# Patient Record
Sex: Female | Born: 2005 | Race: Black or African American | Hispanic: No | Marital: Single | Smoking: Never smoker
Health system: Southern US, Community
[De-identification: ages and names within clinical notes are randomized; demographics above are authoritative.]

## PROBLEM LIST (undated history)

## (undated) ENCOUNTER — Emergency Department (HOSPITAL_COMMUNITY): Admission: EM | Payer: Medicaid Other

## (undated) ENCOUNTER — Inpatient Hospital Stay (HOSPITAL_COMMUNITY): Payer: Self-pay

## (undated) DIAGNOSIS — D573 Sickle-cell trait: Secondary | ICD-10-CM

## (undated) DIAGNOSIS — A749 Chlamydial infection, unspecified: Secondary | ICD-10-CM

## (undated) DIAGNOSIS — A549 Gonococcal infection, unspecified: Secondary | ICD-10-CM

## (undated) DIAGNOSIS — O24419 Gestational diabetes mellitus in pregnancy, unspecified control: Secondary | ICD-10-CM

## (undated) DIAGNOSIS — J45909 Unspecified asthma, uncomplicated: Secondary | ICD-10-CM

## (undated) DIAGNOSIS — O139 Gestational [pregnancy-induced] hypertension without significant proteinuria, unspecified trimester: Secondary | ICD-10-CM

## (undated) HISTORY — DX: Gonococcal infection, unspecified: A54.9

## (undated) HISTORY — DX: Chlamydial infection, unspecified: A74.9

## (undated) HISTORY — PX: NO PAST SURGERIES: SHX2092

---
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
## 2023-01-03 ENCOUNTER — Inpatient Hospital Stay (HOSPITAL_COMMUNITY)
Admission: AD | Admit: 2023-01-03 | Discharge: 2023-01-03 | Disposition: A | Discharge status: Home / Self Care | Payer: Medicaid Other | Attending: Obstetrics & Gynecology | Admitting: Obstetrics & Gynecology

## 2023-01-03 ENCOUNTER — Encounter (HOSPITAL_COMMUNITY): Payer: Self-pay | Admitting: Obstetrics & Gynecology

## 2023-01-03 DIAGNOSIS — Z3A19 19 weeks gestation of pregnancy: Secondary | ICD-10-CM | POA: Diagnosis not present

## 2023-01-03 DIAGNOSIS — O98512 Other viral diseases complicating pregnancy, second trimester: Secondary | ICD-10-CM | POA: Insufficient documentation

## 2023-01-03 DIAGNOSIS — Z1152 Encounter for screening for COVID-19: Secondary | ICD-10-CM | POA: Insufficient documentation

## 2023-01-03 DIAGNOSIS — D573 Sickle-cell trait: Secondary | ICD-10-CM

## 2023-01-03 DIAGNOSIS — J069 Acute upper respiratory infection, unspecified: Secondary | ICD-10-CM | POA: Diagnosis not present

## 2023-01-03 DIAGNOSIS — O99512 Diseases of the respiratory system complicating pregnancy, second trimester: Secondary | ICD-10-CM | POA: Insufficient documentation

## 2023-01-03 DIAGNOSIS — B9789 Other viral agents as the cause of diseases classified elsewhere: Secondary | ICD-10-CM | POA: Insufficient documentation

## 2023-01-03 DIAGNOSIS — Z3490 Encounter for supervision of normal pregnancy, unspecified, unspecified trimester: Secondary | ICD-10-CM

## 2023-01-03 DIAGNOSIS — O26892 Other specified pregnancy related conditions, second trimester: Secondary | ICD-10-CM | POA: Insufficient documentation

## 2023-01-03 DIAGNOSIS — O99012 Anemia complicating pregnancy, second trimester: Secondary | ICD-10-CM | POA: Insufficient documentation

## 2023-01-03 LAB — URINALYSIS, ROUTINE W REFLEX MICROSCOPIC
Bilirubin Urine: NEGATIVE
Glucose, UA: NEGATIVE mg/dL
Hgb urine dipstick: NEGATIVE
Ketones, ur: 5 mg/dL — AB
Leukocytes,Ua: NEGATIVE
Nitrite: NEGATIVE
Protein, ur: NEGATIVE mg/dL
Specific Gravity, Urine: 1.008 (ref 1.005–1.030)
pH: 8 (ref 5.0–8.0)

## 2023-01-03 LAB — RESP PANEL BY RT-PCR (RSV, FLU A&B, COVID)  RVPGX2
Influenza A by PCR: NEGATIVE
Influenza B by PCR: NEGATIVE
Resp Syncytial Virus by PCR: NEGATIVE
SARS Coronavirus 2 by RT PCR: NEGATIVE

## 2023-01-03 LAB — GROUP A STREP BY PCR: Group A Strep by PCR: NOT DETECTED

## 2023-01-03 MED ORDER — CYCLOBENZAPRINE HCL 10 MG PO TABS: 10.0000 mg | ORAL_TABLET | Freq: Two times a day (BID) | ORAL | 1 refills | Status: DC | PRN

## 2023-01-03 NOTE — MAU Provider Note (Signed)
History     [id number]  Arrival date and time: 01/03/23 1357    Chief Complaint  Patient presents with   Fever   Nasal Congestion   Headache     HPI Erin Mack is a 17 y.o. at [redacted]w[redacted]d, who presents for multiple symptoms.   Patient reports being sick for the past few days Primary symptoms have been runny nose, congestion, cough, headache, muscle aches, and fever up to 101F Reports her boyfriend is also sick but with milder symptoms Denies any associated with phlegm with cough No chest pain or shortness of breath No vaginal bleeding or leaking fluid No lower abdominal pain but does have intermittent epigastric pain  O/Positive/-- (08/21 1151)  OB History     Gravida  1   Para  0   Term  0   Preterm  0   AB  0   Living  0      SAB  0   IAB  0   Ectopic  0   Multiple  0   Live Births  0           Past Medical History:  Diagnosis Date   Asthma    Chlamydia    Gonorrhea    Sickle cell trait (HCC)     History reviewed. No pertinent surgical history.  Family History  Problem Relation Age of Onset   Cancer Paternal Grandfather    Lupus Paternal Grandmother    Cervical cancer Maternal Grandmother    Breast cancer Maternal Grandmother 57   Lupus Father    Sickle cell anemia Mother    Asthma Sister    Autism Sister     Social History   Socioeconomic History   Marital status: Single    Spouse name: Not on file   Number of children: Not on file   Years of education: Not on file   Highest education level: Not on file  Occupational History   Not on file  Tobacco Use   Smoking status: Never    Passive exposure: Never   Smokeless tobacco: Never  Vaping Use   Vaping status: Former  Substance and Sexual Activity   Alcohol use: Not Currently    Comment: occasionally   Drug use: Not Currently    Types: Marijuana    Comment: last used a few months prior to pregnancy   Sexual activity: Yes    Birth control/protection: None  Other  Topics Concern   Not on file  Social History Narrative   Not on file   Social Determinants of Health   Financial Resource Strain: Not on file  Food Insecurity: Not on file  Transportation Needs: Not on file  Physical Activity: Not on file  Stress: Not on file  Social Connections: Not on file  Intimate Partner Violence: Not on file    Allergies  Allergen Reactions   Black Walnut Pollen Allergy Skin Test Shortness Of Breath, Itching and Other (See Comments)    Mouth itches   Charentais Melon (French Melon) Shortness Of Breath, Itching, Nausea Only and Other (See Comments)    Mouth itches   Justicia Adhatoda (Malabar Nut Tree) [Justicia Adhatoda] Shortness Of Breath, Itching and Other (See Comments)    Mouth itches   Sesame Oil Shortness Of Breath, Itching and Other (See Comments)    Mouth itches   Watermelon [Citrullus Vulgaris] Shortness Of Breath, Itching and Other (See Comments)    Mouth itches   Aloe Rash and Other (See Comments)  Made the skin burn    No current facility-administered medications on file prior to encounter.   Current Outpatient Medications on File Prior to Encounter  Medication Sig Dispense Refill   Acetaminophen (TYLENOL PO) Take by mouth as needed.     Prenatal Vit-Fe Fumarate-FA (MULTIVITAMIN-PRENATAL) 27-0.8 MG TABS tablet Take 1 tablet by mouth daily at 12 noon. 30 tablet 12   ALBUTEROL IN Inhale into the lungs. (Patient not taking: Reported on 12/01/2022)       ROS Pertinent positives and negative per HPI, all others reviewed and negative  Physical Exam   BP (!) 99/54 (BP Location: Right Arm)   Pulse (!) 144   Temp 99 F (37.2 C) (Oral)   Resp 20   Ht 5\' 5"  (1.651 m)   Wt 77.6 kg   LMP 08/05/2022 (Within Weeks)   SpO2 100%   BMI 28.46 kg/m   Patient Vitals for the past 24 hrs:  BP Temp Temp src Pulse Resp SpO2 Height Weight  01/03/23 1416 (!) 99/54 99 F (37.2 C) Oral (!) 144 20 100 % 5\' 5"  (1.651 m) 77.6 kg    Physical  Exam Vitals reviewed.  Constitutional:      General: She is not in acute distress.    Appearance: She is well-developed. She is not diaphoretic.  Eyes:     General: No scleral icterus. Pulmonary:     Effort: Pulmonary effort is normal. No respiratory distress.  Abdominal:     General: There is no distension.     Palpations: Abdomen is soft.     Tenderness: There is abdominal tenderness. There is no guarding or rebound.     Comments: Diffuse mild ttp  Skin:    General: Skin is warm and dry.  Neurological:     Mental Status: She is alert.     Coordination: Coordination normal.      Cervical Exam    Bedside Ultrasound Not performed.  My interpretation: n/a  FHT 167 bpm by doppler  Labs Results for orders placed or performed during the hospital encounter of 01/03/23 (from the past 24 hour(s))  Resp panel by RT-PCR (RSV, Flu A&B, Covid) Anterior Nasal Swab     Status: None   Collection Time: 01/03/23  2:35 PM   Specimen: Anterior Nasal Swab  Result Value Ref Range   SARS Coronavirus 2 by RT PCR NEGATIVE NEGATIVE   Influenza A by PCR NEGATIVE NEGATIVE   Influenza B by PCR NEGATIVE NEGATIVE   Resp Syncytial Virus by PCR NEGATIVE NEGATIVE  Group A Strep by PCR     Status: None   Collection Time: 01/03/23  2:35 PM   Specimen: Anterior Nasal Swab; Sterile Swab  Result Value Ref Range   Group A Strep by PCR NOT DETECTED NOT DETECTED    Imaging No results found.  MAU Course  Procedures Lab Orders         Resp panel by RT-PCR (RSV, Flu A&B, Covid) Anterior Nasal Swab         Group A Strep by PCR         Urinalysis, Routine w reflex microscopic -Urine, Clean Catch    No orders of the defined types were placed in this encounter.  Imaging Orders  No imaging studies ordered today    MDM Moderate (Level 3-4)  Assessment and Plan  # Viral URI #[redacted] weeks gestation of pregnancy Well appearing on exam, no signs of respiratory distress or dehydration. Respiratory viral  panel negative.  Supportive care with tylenol, fluids, given safe meds in pregnancy list.  #FWB FHR normal on bedside doppler.    Dispo: discharged to home in stable condition    Venora Maples, MD/MPH 01/03/23 3:56 PM  Allergies as of 01/03/2023       Reactions   Black Walnut Pollen Allergy Skin Test Shortness Of Breath, Itching, Other (See Comments)   Mouth itches   Charentais Melon (french Melon) Shortness Of Breath, Itching, Nausea Only, Other (See Comments)   Mouth itches   Justicia Adhatoda (malabar Nut Tree) [justicia Adhatoda] Shortness Of Breath, Itching, Other (See Comments)   Mouth itches   Sesame Oil Shortness Of Breath, Itching, Other (See Comments)   Mouth itches   Watermelon [citrullus Vulgaris] Shortness Of Breath, Itching, Other (See Comments)   Mouth itches   Aloe Rash, Other (See Comments)   Made the skin burn        Medication List     TAKE these medications    ALBUTEROL IN Inhale into the lungs.   multivitamin-prenatal 27-0.8 MG Tabs tablet Take 1 tablet by mouth daily at 12 noon.   TYLENOL PO Take by mouth as needed.

## 2023-01-03 NOTE — Discharge Instructions (Signed)

## 2023-01-03 NOTE — MAU Note (Signed)
..  Erin Mack is a 17 y.o. at [redacted]w[redacted]d here in MAU reporting: for the last two days she has had a runny nose, muscle aches, fever, and a dry cough. Also reporting a sore throat. She also reports sharp pain in her lower abdomen. Denies VB or LOF. Has not taken tylenol since last night.   Pain score: body aches: 6    throat: 8    head: 10 Vitals:   01/03/23 1416  BP: (!) 99/54  Pulse: (!) 144  Resp: 20  Temp: 99 F (37.2 C)  SpO2: 100%     FHT:167 Lab orders placed from triage:   respiratory panel, group A

## 2023-01-04 ENCOUNTER — Ambulatory Visit: Payer: Medicaid Other | Admitting: Nurse Practitioner

## 2023-01-06 ENCOUNTER — Other Ambulatory Visit: Payer: Self-pay

## 2023-01-06 ENCOUNTER — Other Ambulatory Visit: Payer: Self-pay | Admitting: *Deleted

## 2023-01-06 ENCOUNTER — Ambulatory Visit: Payer: Medicaid Other

## 2023-01-06 ENCOUNTER — Ambulatory Visit: Payer: Medicaid Other | Attending: Family Medicine

## 2023-01-06 DIAGNOSIS — Z3A20 20 weeks gestation of pregnancy: Secondary | ICD-10-CM | POA: Insufficient documentation

## 2023-01-06 DIAGNOSIS — D573 Sickle-cell trait: Secondary | ICD-10-CM | POA: Insufficient documentation

## 2023-01-06 DIAGNOSIS — Z3490 Encounter for supervision of normal pregnancy, unspecified, unspecified trimester: Secondary | ICD-10-CM | POA: Diagnosis not present

## 2023-01-06 DIAGNOSIS — Z3A22 22 weeks gestation of pregnancy: Secondary | ICD-10-CM

## 2023-01-06 DIAGNOSIS — Z862 Personal history of diseases of the blood and blood-forming organs and certain disorders involving the immune mechanism: Secondary | ICD-10-CM | POA: Diagnosis not present

## 2023-01-06 DIAGNOSIS — Z363 Encounter for antenatal screening for malformations: Secondary | ICD-10-CM | POA: Diagnosis not present

## 2023-01-26 ENCOUNTER — Other Ambulatory Visit: Payer: Self-pay

## 2023-01-26 ENCOUNTER — Encounter: Payer: Self-pay | Admitting: Family Medicine

## 2023-01-26 ENCOUNTER — Ambulatory Visit (INDEPENDENT_AMBULATORY_CARE_PROVIDER_SITE_OTHER): Payer: Medicaid Other | Admitting: Family Medicine

## 2023-01-26 VITALS — BP 113/76 | HR 108 | Wt 184.0 lb

## 2023-01-26 DIAGNOSIS — D573 Sickle-cell trait: Secondary | ICD-10-CM

## 2023-01-26 DIAGNOSIS — Z3492 Encounter for supervision of normal pregnancy, unspecified, second trimester: Secondary | ICD-10-CM | POA: Diagnosis not present

## 2023-01-26 NOTE — Progress Notes (Signed)
   PRENATAL VISIT NOTE  Subjective:  Erin Mack is a 17 y.o. G1P0000 at [redacted]w[redacted]d being seen today for ongoing prenatal care.  She is currently monitored for the following issues for this low-risk pregnancy and has Asthma; Supervision of low-risk pregnancy; Vitamin D deficiency; Low TSH level; and Sickle cell trait (HCC) on their problem list.  Patient reports no complaints.  Contractions: Not present. Vag. Bleeding: None.  Movement: Present. Denies leaking of fluid.   The following portions of the patient's history were reviewed and updated as appropriate: allergies, current medications, past family history, past medical history, past social history, past surgical history and problem list.   Objective:   Vitals:   01/26/23 1620  BP: 113/76  Pulse: (!) 108  Weight: 184 lb (83.5 kg)    Fetal Status: Fetal Heart Rate (bpm): 158 Fundal Height: 24 cm Movement: Present     General:  Alert, oriented and cooperative. Patient is in no acute distress.  Skin: Skin is warm and dry. No rash noted.   Cardiovascular: Normal heart rate noted  Respiratory: Normal respiratory effort, no problems with respiration noted  Abdomen: Soft, gravid, appropriate for gestational age.  Pain/Pressure: Present     Pelvic: Cervical exam deferred        Extremities: Normal range of motion.     Mental Status: Normal mood and affect. Normal behavior. Normal judgment and thought content.   Assessment and Plan:  Pregnancy: G1P0000 at [redacted]w[redacted]d 1. Encounter for supervision of low-risk pregnancy in second trimester Up to date FH appropriate Vigorous movement No concerns today Anticipated next visit.   2. Sickle Cell trait UCx next visit   Preterm labor symptoms and general obstetric precautions including but not limited to vaginal bleeding, contractions, leaking of fluid and fetal movement were reviewed in detail with the patient. Please refer to After Visit Summary for other counseling recommendations.   Return  in about 4 weeks (around 02/23/2023) for Routine prenatal care, [facility].  Future Appointments  Date Time Provider Department Center  02/22/2023  8:15 AM Venora Maples, MD [facility]  02/22/2023  8:50 AM [facility]  03/03/2023  3:30 PM [facility] US1 [facility]    Federico Flake, MD

## 2023-02-11 ENCOUNTER — Other Ambulatory Visit: Payer: Self-pay

## 2023-02-11 DIAGNOSIS — Z3A26 26 weeks gestation of pregnancy: Secondary | ICD-10-CM

## 2023-02-22 ENCOUNTER — Encounter: Payer: Self-pay | Admitting: Family Medicine

## 2023-02-22 ENCOUNTER — Other Ambulatory Visit: Payer: Self-pay

## 2023-02-22 ENCOUNTER — Ambulatory Visit (INDEPENDENT_AMBULATORY_CARE_PROVIDER_SITE_OTHER): Payer: Medicaid Other | Admitting: Family Medicine

## 2023-02-22 ENCOUNTER — Other Ambulatory Visit: Payer: Medicaid Other

## 2023-02-22 VITALS — BP 116/75 | HR 105 | Wt 196.6 lb

## 2023-02-22 DIAGNOSIS — E559 Vitamin D deficiency, unspecified: Secondary | ICD-10-CM

## 2023-02-22 DIAGNOSIS — R7989 Other specified abnormal findings of blood chemistry: Secondary | ICD-10-CM

## 2023-02-22 DIAGNOSIS — Z23 Encounter for immunization: Secondary | ICD-10-CM | POA: Diagnosis not present

## 2023-02-22 DIAGNOSIS — O24419 Gestational diabetes mellitus in pregnancy, unspecified control: Secondary | ICD-10-CM

## 2023-02-22 DIAGNOSIS — Z3492 Encounter for supervision of normal pregnancy, unspecified, second trimester: Secondary | ICD-10-CM

## 2023-02-22 DIAGNOSIS — D573 Sickle-cell trait: Secondary | ICD-10-CM

## 2023-02-22 DIAGNOSIS — Z3A26 26 weeks gestation of pregnancy: Secondary | ICD-10-CM

## 2023-02-22 NOTE — Progress Notes (Signed)
Subjective:  Erin Mack is a 17 y.o. G1P0000 at [redacted]w[redacted]d being seen today for ongoing prenatal care.  She is currently monitored for the following issues for this low-risk pregnancy and has Asthma; Supervision of low-risk pregnancy; Vitamin D deficiency; Low TSH level; and Sickle cell trait (HCC) on their problem list.  Patient reports  diffuse pain. Her mother has sickle cell and is worried she is having pain crises with her sickle trait. Horizon not performed in this pregnancy .  Contractions: Irritability. Vag. Bleeding: None.  Movement: Present. Denies leaking of fluid.   The following portions of the patient's history were reviewed and updated as appropriate: allergies, current medications, past family history, past medical history, past social history, past surgical history and problem list. Problem list updated.  Objective:   Vitals:   02/22/23 0849  BP: 116/75  Pulse: (!) 105  Weight: 196 lb 9.6 oz (89.2 kg)    Fetal Status: Fetal Heart Rate (bpm): 155   Movement: Present     General:  Alert, oriented and cooperative. Patient is in no acute distress.  Skin: Skin is warm and dry. No rash noted.   Cardiovascular: Normal heart rate noted  Respiratory: Normal respiratory effort, no problems with respiration noted  Abdomen: Soft, gravid, appropriate for gestational age. Pain/Pressure: Present     Pelvic: Vag. Bleeding: None     Cervical exam deferred        Extremities: Normal range of motion.     Mental Status: Normal mood and affect. Normal behavior. Normal judgment and thought content.   Urinalysis:      Assessment and Plan:  Pregnancy: G1P0000 at [redacted]w[redacted]d  1. Encounter for supervision of low-risk pregnancy in second trimester BP and FHR normal 28 wk labs today TDAP given  Flu previously declined  2. Low TSH level Noted in chart, no recent checks, will obtain today - TSH Rfx on Abnormal to Free T4  3. Vitamin D deficiency Last level 10 a year ago, severely  deficient Recheck today but will likely need supplementation - Vitamin D (25 hydroxy)  4. Sickle cell trait (HCC) Q trimester UCx per MFM Reviewed concern for pain crises with patient and mother, long conversation Reviewed chart in detail, has previously had an extensive and unrevealing workup with Rheumatology at [facility] in 2022 and 2023 Per last note there was no clear etiology, some mild endocrine abnormalities were found and patient was referred to Endocrine and Neurology for possible eval of neuropathy Patient's mother worried she may be having pain crises which she had during her pregnancy Discussed it is not a good idea to overlay experiences from one pregnancy to another, and that at present Erin Mack has had an extensive workup previously In addition she is wanting to know about a pain regimen, specifically opioid medicines. They both report that tylenol and flexeril are not effective I discussed that this would be inappropriate outside of true sickle cell and would come with unacceptable side effects (risk of addiction being foremost for mom and NAS for baby) Notably though we do not have hemoglobinopathy testing on [name], will collect that today Pending that will determine what therapy to pursue, likely neuropathy (gabapentin, duloxetine, etc.) - Culture, OB Urine  Preterm labor symptoms and general obstetric precautions including but not limited to vaginal bleeding, contractions, leaking of fluid and fetal movement were reviewed in detail with the patient. Please refer to After Visit Summary for other counseling recommendations.  Return in 2 weeks (on 03/08/2023) for Dyad  patient, ob visit.   Venora Maples, MD

## 2023-02-22 NOTE — Patient Instructions (Signed)

## 2023-02-23 ENCOUNTER — Telehealth: Payer: Self-pay

## 2023-02-23 ENCOUNTER — Encounter: Payer: Self-pay | Admitting: Family Medicine

## 2023-02-23 DIAGNOSIS — O24419 Gestational diabetes mellitus in pregnancy, unspecified control: Secondary | ICD-10-CM | POA: Insufficient documentation

## 2023-02-23 DIAGNOSIS — D696 Thrombocytopenia, unspecified: Secondary | ICD-10-CM | POA: Insufficient documentation

## 2023-02-23 LAB — CBC
Hematocrit: 32 % — ABNORMAL LOW (ref 34.0–46.6)
Hemoglobin: 10.7 g/dL — ABNORMAL LOW (ref 11.1–15.9)
MCH: 27.6 pg (ref 26.6–33.0)
MCHC: 33.4 g/dL (ref 31.5–35.7)
MCV: 83 fL (ref 79–97)
Platelets: 127 10*3/uL — ABNORMAL LOW (ref 150–450)
RBC: 3.88 x10E6/uL (ref 3.77–5.28)
RDW: 12.5 % (ref 11.7–15.4)
WBC: 9.1 10*3/uL (ref 3.4–10.8)

## 2023-02-23 LAB — GLUCOSE TOLERANCE, 2 HOURS W/ 1HR
Glucose, 1 hour: 107 mg/dL (ref 70–179)
Glucose, 2 hour: 96 mg/dL (ref 70–152)
Glucose, Fasting: 94 mg/dL — ABNORMAL HIGH (ref 70–91)

## 2023-02-23 LAB — HIV ANTIBODY (ROUTINE TESTING W REFLEX): HIV Screen 4th Generation wRfx: NONREACTIVE

## 2023-02-23 LAB — TSH RFX ON ABNORMAL TO FREE T4: TSH: 1.69 u[IU]/mL (ref 0.450–4.500)

## 2023-02-23 LAB — RPR: RPR Ser Ql: NONREACTIVE

## 2023-02-23 LAB — VITAMIN D 25 HYDROXY (VIT D DEFICIENCY, FRACTURES): Vit D, 25-Hydroxy: 23.7 ng/mL — ABNORMAL LOW (ref 30.0–100.0)

## 2023-02-23 MED ORDER — ACCU-CHEK SOFTCLIX LANCETS MISC
12 refills | Status: DC
Start: 2023-02-23 — End: 2023-05-12

## 2023-02-23 MED ORDER — GLUCOSE BLOOD VI STRP: ORAL_STRIP | 12 refills | Status: DC

## 2023-02-23 MED ORDER — ACCU-CHEK GUIDE W/DEVICE KIT: 1.0000 | PACK | Freq: Four times a day (QID) | 0 refills | Status: DC

## 2023-02-23 NOTE — Telephone Encounter (Signed)
-----   Message from Venora Maples sent at 02/23/2023  8:39 AM EST ----- 28 wk labs with mild gestational thrombocytopenia and abnormal GTT. Unfortunately has GDM, clinical please send supplies, admin please schedule DM educator visit

## 2023-02-23 NOTE — Telephone Encounter (Signed)
Left voicemail to inform patient of results from recent labs that was done at our office. Need to inform patient of glucose results and to tell patient we sent in supplies to pharmacy and made an appointment regarding GDM education.  Appointment made for patient for GDM education at [facility] on [address] for Wednesday, December 11th at 9:00AM. This session will take approximately 2 hours long.  Marcelino Duster, RN

## 2023-02-23 NOTE — Telephone Encounter (Signed)
Call placed to pt. Pt did not answer. Left VM with information to return call to office for results. Will send mychart message today. Supplies sent to pharmacy on 02/23/2023. Diabetes Ed appt on 03/17/23 at 315pm  [place], [facility]

## 2023-02-24 LAB — URINE CULTURE, OB REFLEX

## 2023-02-24 LAB — CULTURE, OB URINE

## 2023-02-24 NOTE — Telephone Encounter (Signed)
Called pt and pt informed me that she has already picked up testing supplies and that she has a class scheduled on 03/02/23.  I advised pt that at her appt scheduled on 03/02/23 they teach how to obtain her BS.   Pt verbalized understanding with on further questions.   Dariel Betzer,RN  02/24/23

## 2023-02-25 LAB — HGB FRACTIONATION CASCADE
Hgb A2: 3.3 % — ABNORMAL HIGH (ref 1.8–3.2)
Hgb A: 61.7 % — ABNORMAL LOW (ref 96.4–98.8)
Hgb F: 0 % (ref 0.0–2.0)
Hgb S: 35 % — ABNORMAL HIGH

## 2023-02-25 LAB — HGB SOLUBILITY: Hgb Solubility: POSITIVE — AB

## 2023-03-02 ENCOUNTER — Encounter: Payer: Medicaid Other | Attending: Family Medicine | Admitting: Dietician

## 2023-03-02 DIAGNOSIS — O24419 Gestational diabetes mellitus in pregnancy, unspecified control: Secondary | ICD-10-CM | POA: Insufficient documentation

## 2023-03-02 NOTE — Progress Notes (Signed)
Patient was seen on 03/02/2023 for Gestational Diabetes self-management class at the [facility]. The following learning objectives were met by the patient during this course:  States the definition of Gestational Diabetes States why dietary management is important in controlling blood glucose Describes the effects each nutrient has on blood glucose levels Demonstrates ability to create a balanced meal plan Demonstrates carbohydrate counting  States when to check blood glucose levels States the effect of stress and exercise on blood glucose levels States the importance of limiting caffeine and abstaining from alcohol and smoking Meter instruction for Accu Chek guide  Blood glucose monitor: none RD spoke with pharmacy staff and provided free meter code below. Pharmacy staff state accu chek meter and supplies ready for pick up Free Accu Chek meter BIN#: [id number] PCN#: [id number], Group#: [id number]  Blood glucose reading: 94 mg/dL, reported as fasting per Pt   Patient instructed to monitor glucose levels: QID FBS: 60 - <90 1 hour: <140 2 hour: <120  *Patient received handouts: Nutrition Diabetes and Pregnancy Carbohydrate Counting List Blood glucose log Snack ideas for diabetes during pregnancy  Patient will be seen for follow-up as needed.

## 2023-03-03 ENCOUNTER — Ambulatory Visit: Payer: Medicaid Other | Admitting: Maternal & Fetal Medicine

## 2023-03-03 ENCOUNTER — Ambulatory Visit: Payer: Medicaid Other

## 2023-03-03 ENCOUNTER — Other Ambulatory Visit: Payer: Self-pay | Admitting: *Deleted

## 2023-03-03 ENCOUNTER — Ambulatory Visit: Payer: Medicaid Other | Attending: Obstetrics

## 2023-03-03 DIAGNOSIS — O99113 Other diseases of the blood and blood-forming organs and certain disorders involving the immune mechanism complicating pregnancy, third trimester: Secondary | ICD-10-CM | POA: Insufficient documentation

## 2023-03-03 DIAGNOSIS — O26643 Intrahepatic cholestasis of pregnancy, third trimester: Secondary | ICD-10-CM | POA: Insufficient documentation

## 2023-03-03 DIAGNOSIS — D696 Thrombocytopenia, unspecified: Secondary | ICD-10-CM | POA: Insufficient documentation

## 2023-03-03 DIAGNOSIS — O24410 Gestational diabetes mellitus in pregnancy, diet controlled: Secondary | ICD-10-CM | POA: Diagnosis not present

## 2023-03-03 DIAGNOSIS — Z3A28 28 weeks gestation of pregnancy: Secondary | ICD-10-CM | POA: Diagnosis present

## 2023-03-03 DIAGNOSIS — O09292 Supervision of pregnancy with other poor reproductive or obstetric history, second trimester: Secondary | ICD-10-CM | POA: Diagnosis not present

## 2023-03-03 DIAGNOSIS — D573 Sickle-cell trait: Secondary | ICD-10-CM | POA: Insufficient documentation

## 2023-03-03 DIAGNOSIS — O0993 Supervision of high risk pregnancy, unspecified, third trimester: Secondary | ICD-10-CM | POA: Diagnosis present

## 2023-03-03 DIAGNOSIS — K831 Obstruction of bile duct: Secondary | ICD-10-CM

## 2023-03-03 DIAGNOSIS — O26613 Liver and biliary tract disorders in pregnancy, third trimester: Secondary | ICD-10-CM

## 2023-03-03 DIAGNOSIS — O99019 Anemia complicating pregnancy, unspecified trimester: Secondary | ICD-10-CM | POA: Diagnosis present

## 2023-03-03 DIAGNOSIS — O99013 Anemia complicating pregnancy, third trimester: Secondary | ICD-10-CM | POA: Diagnosis not present

## 2023-03-03 LAB — HORIZON CUSTOM: REPORT SUMMARY: POSITIVE — AB

## 2023-03-03 NOTE — Progress Notes (Signed)
Patient information  Patient Name: Erin Mack  Patient MRN:   [id number]  Referring practice: MFM Referring Provider: [facility])  MFM CONSULT  [facility] Gl[facility] is a 17 y.o. G1P0000 at [redacted]w[redacted]d here for ultrasound and consultation.   The patient is here for ultrasound for newly diagnosed GDM. EDD: 05/25/2023 dated by U/S C R L  (10/20/22).  Her pregnancy is complicated by newly diagnosed GDM and has one day of blood glucose values and all of them are at goal.  She understands the potential complications associated with gestational diabetes in pregnancy such as growth abnormalities as well as the importance of good glucose control.   The patient reports new onset itching that is worse at night on her hands feet and abdomen.  Her mother, who is with her today, reports that she had cholestasis of pregnancy and is concerned about her daughter having it as well.  I discussed that this is a clinical diagnosis and most likely what she has.  Will order blood work today to confirm.  I discussed the concern for spontaneous stillbirth in the setting of cholestasis especially with high bile acid levels.  I also discussed need for antenatal testing to start around 32 weeks unless the bile acids are very high.  If they are over 100 then antenatal testing should start within the week of diagnosis.  Sonographic findings Single intrauterine pregnancy at 28w 1d.  Fetal cardiac activity:  Observed and appears normal. Presentation: Cephalic. Interval fetal anatomy appears normal.  Fetal biometry shows the estimated fetal weight at the 65 percentile. Amniotic fluid volume: Within normal limits. MVP: 3.71 cm. Placenta: Posterior. Normal fetal movement and tone.  Patient Active Problem List   Diagnosis Date Noted   Cholestasis of pregnancy in third trimester 03/03/2023   Gestational thrombocytopenia (HCC) 02/23/2023   GDM (gestational diabetes mellitus) 02/23/2023   Sickle cell trait  (HCC) 11/10/2022   Asthma 10/19/2022   Supervision of low-risk pregnancy 10/19/2022   Vitamin D deficiency 05/08/2021   Low TSH level 05/08/2021   Recommendations 1. Serial growth ultrasounds every 4 weeks until delivery 2.  Antenatal testing is only indicated if she requires medication to control her blood glucose 3.  CMP and bile acids due to concern for cholestasis.  Regardless of the bile acid level this is a clinical diagnosis and should be managed accordingly.  Repeat bile acids every month are important to help stratify delivery timing.   Review of Systems: A review of systems was performed and was negative except per HPI   Vitals and Physical Exam    02/22/2023    8:49 AM 01/26/2023    4:20 PM 01/03/2023    4:08 PM  Vitals with BMI  Weight 196 lbs 10 oz 184 lbs   BMI  30.62   Systolic 116 113 782  Diastolic 75 76 62  Pulse 105 108 128    Sitting comfortably on the sonogram table Nonlabored breathing Normal rate and rhythm Abdomen is nontender  Past pregnancies OB History  Gravida Para Term Preterm AB Living  1 0 0 0 0 0  SAB IAB Ectopic Multiple Live Births  0 0 0 0 0    # Outcome Date GA Lbr Len/2nd Weight Sex Type Anes PTL Lv  1 Current             I spent 45 minutes reviewing the patients chart, including labs and images as well as counseling the patient about  her medical conditions. Greater than 50% of the time was spent in direct face-to-face patient counseling.  Braxton Feathers  MFM, [place]   03/03/2023  4:30 PM

## 2023-03-04 LAB — COMPREHENSIVE METABOLIC PANEL
ALT: 25 [IU]/L (ref 0–32)
AST: 15 [IU]/L (ref 0–40)
Albumin: 3.7 g/dL — ABNORMAL LOW (ref 4.0–5.0)
Alkaline Phosphatase: 97 [IU]/L (ref 42–106)
BUN/Creatinine Ratio: 6 — ABNORMAL LOW (ref 9–23)
BUN: 4 mg/dL — ABNORMAL LOW (ref 6–20)
Bilirubin Total: 0.3 mg/dL (ref 0.0–1.2)
CO2: 23 mmol/L (ref 20–29)
Calcium: 9.2 mg/dL (ref 8.7–10.2)
Chloride: 107 mmol/L — ABNORMAL HIGH (ref 96–106)
Creatinine, Ser: 0.69 mg/dL (ref 0.57–1.00)
Globulin, Total: 2.5 g/dL (ref 1.5–4.5)
Glucose: 107 mg/dL — ABNORMAL HIGH (ref 70–99)
Potassium: 4.3 mmol/L (ref 3.5–5.2)
Sodium: 141 mmol/L (ref 134–144)
Total Protein: 6.2 g/dL (ref 6.0–8.5)
eGFR: 128 mL/min/{1.73_m2} (ref >59)

## 2023-03-04 LAB — BILE ACIDS, TOTAL: Bile Acids Total: 8.3 umol/L (ref 0.0–10.0)

## 2023-03-07 ENCOUNTER — Encounter: Payer: Self-pay | Admitting: Family Medicine

## 2023-03-07 DIAGNOSIS — D563 Thalassemia minor: Secondary | ICD-10-CM | POA: Insufficient documentation

## 2023-03-08 ENCOUNTER — Encounter: Payer: Self-pay | Admitting: Family Medicine

## 2023-03-08 ENCOUNTER — Ambulatory Visit: Payer: Medicaid Other | Admitting: Family Medicine

## 2023-03-08 ENCOUNTER — Other Ambulatory Visit: Payer: Self-pay

## 2023-03-08 VITALS — BP 120/68 | HR 122 | Wt 193.8 lb

## 2023-03-08 DIAGNOSIS — R7989 Other specified abnormal findings of blood chemistry: Secondary | ICD-10-CM | POA: Diagnosis not present

## 2023-03-08 DIAGNOSIS — D563 Thalassemia minor: Secondary | ICD-10-CM

## 2023-03-08 DIAGNOSIS — E559 Vitamin D deficiency, unspecified: Secondary | ICD-10-CM | POA: Diagnosis not present

## 2023-03-08 DIAGNOSIS — Z1332 Encounter for screening for maternal depression: Secondary | ICD-10-CM

## 2023-03-08 DIAGNOSIS — O24419 Gestational diabetes mellitus in pregnancy, unspecified control: Secondary | ICD-10-CM

## 2023-03-08 DIAGNOSIS — Z3A28 28 weeks gestation of pregnancy: Secondary | ICD-10-CM

## 2023-03-08 DIAGNOSIS — O26643 Intrahepatic cholestasis of pregnancy, third trimester: Secondary | ICD-10-CM

## 2023-03-08 DIAGNOSIS — O0993 Supervision of high risk pregnancy, unspecified, third trimester: Secondary | ICD-10-CM | POA: Diagnosis not present

## 2023-03-08 DIAGNOSIS — D573 Sickle-cell trait: Secondary | ICD-10-CM

## 2023-03-08 NOTE — Progress Notes (Signed)
   Subjective:  Erin Mack is a 17 y.o. G1P0000 at [redacted]w[redacted]d being seen today for ongoing prenatal care.  She is currently monitored for the following issues for this high-risk pregnancy and has Asthma; Supervision of high risk pregnancy, antepartum, third trimester; Vitamin D deficiency; Sickle cell trait (HCC); Gestational thrombocytopenia (HCC); GDM (gestational diabetes mellitus); Cholestasis of pregnancy in third trimester; and Alpha thalassemia silent carrier on their problem list.  Patient reports  diffuse itching .  Contractions: Not present. Vag. Bleeding: None.  Movement: Present. Denies leaking of fluid.   The following portions of the patient's history were reviewed and updated as appropriate: allergies, current medications, past family history, past medical history, past social history, past surgical history and problem list. Problem list updated.  Objective:   Vitals:   03/08/23 1604  BP: 120/68  Pulse: (!) 122  Weight: 193 lb 12.8 oz (87.9 kg)    Fetal Status: Fetal Heart Rate (bpm): 156   Movement: Present     General:  Alert, oriented and cooperative. Patient is in no acute distress.  Skin: Skin is warm and dry. No rash noted.   Cardiovascular: Normal heart rate noted  Respiratory: Normal respiratory effort, no problems with respiration noted  Abdomen: Soft, gravid, appropriate for gestational age. Pain/Pressure: Absent     Pelvic: Vag. Bleeding: None     Cervical exam deferred        Extremities: Normal range of motion.     Mental Status: Normal mood and affect. Normal behavior. Normal judgment and thought content.   Urinalysis:      Assessment and Plan:  Pregnancy: G1P0000 at [redacted]w[redacted]d  1. Supervision of high risk pregnancy, antepartum, third trimester (Primary) BP and FHR normal  2. Low TSH level Normal on last check, resolved from PL  3. Vitamin D deficiency Borderline at 23 on last check, OTC supplements  4. Sickle cell trait (HCC) Long discussion regarding  this at last visit, patient's mother worried about possible pain crises Given lack of formal testing recommended we proceed with hemoglobinopathy and Horizon testing which showed both sickle trait and silent alpha thal carrier statuses  Partner reports that her partner has had testing and is definitely neg for sickle, she will check about alpha thalassemia Based on this new information discussed best option would be to see pain specialist. After discussion agreed this would be best to do postpartum  5. Gestational diabetes mellitus (GDM), antepartum, gestational diabetes method of control unspecified New diagnosis after last visit Log reviewed, only one week to review Last growth [place] 03/03/2023, EFW 65% 1295g, AC 66%, AFI wnl  6. 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 For now plan to continue checking labs q month and reassess in the future Stressed importance of letting us know if she develops palmar/foot itching as this would significantly impact management  7. Alpha thalassemia silent carrier See above  Preterm labor symptoms and general obstetric precautions including but not limited to vaginal bleeding, contractions, leaking of fluid and fetal movement were reviewed in detail with the patient. Please refer to After Visit Summary for other counseling recommendations.  Return in about 2 weeks (around 03/22/2023) for Dyad patient, ob visit.   Venora Maples, MD

## 2023-03-08 NOTE — Patient Instructions (Signed)

## 2023-03-17 ENCOUNTER — Other Ambulatory Visit: Payer: Medicaid Other

## 2023-03-22 ENCOUNTER — Encounter (HOSPITAL_COMMUNITY): Payer: Self-pay | Admitting: Family Medicine

## 2023-03-22 ENCOUNTER — Other Ambulatory Visit (HOSPITAL_COMMUNITY)
Admission: RE | Admit: 2023-03-22 | Discharge: 2023-03-22 | Disposition: A | Discharge status: Home / Self Care | Payer: Medicaid Other | Source: Ambulatory Visit | Attending: Family Medicine | Admitting: Family Medicine

## 2023-03-22 ENCOUNTER — Ambulatory Visit (INDEPENDENT_AMBULATORY_CARE_PROVIDER_SITE_OTHER): Payer: Medicaid Other | Admitting: Family Medicine

## 2023-03-22 ENCOUNTER — Inpatient Hospital Stay (HOSPITAL_COMMUNITY)
Admission: AD | Admit: 2023-03-22 | Discharge: 2023-03-22 | Disposition: A | Discharge status: Home / Self Care | Payer: Medicaid Other | Attending: Family Medicine | Admitting: Family Medicine

## 2023-03-22 ENCOUNTER — Other Ambulatory Visit: Payer: Self-pay

## 2023-03-22 VITALS — BP 100/70 | HR 99 | Wt 198.8 lb

## 2023-03-22 DIAGNOSIS — R519 Headache, unspecified: Secondary | ICD-10-CM

## 2023-03-22 DIAGNOSIS — D573 Sickle-cell trait: Secondary | ICD-10-CM | POA: Diagnosis not present

## 2023-03-22 DIAGNOSIS — O0993 Supervision of high risk pregnancy, unspecified, third trimester: Secondary | ICD-10-CM

## 2023-03-22 DIAGNOSIS — B3731 Acute candidiasis of vulva and vagina: Secondary | ICD-10-CM | POA: Diagnosis present

## 2023-03-22 DIAGNOSIS — O26893 Other specified pregnancy related conditions, third trimester: Secondary | ICD-10-CM | POA: Diagnosis not present

## 2023-03-22 DIAGNOSIS — O99013 Anemia complicating pregnancy, third trimester: Secondary | ICD-10-CM | POA: Insufficient documentation

## 2023-03-22 DIAGNOSIS — O24410 Gestational diabetes mellitus in pregnancy, diet controlled: Secondary | ICD-10-CM

## 2023-03-22 DIAGNOSIS — O24419 Gestational diabetes mellitus in pregnancy, unspecified control: Secondary | ICD-10-CM

## 2023-03-22 DIAGNOSIS — D6959 Other secondary thrombocytopenia: Secondary | ICD-10-CM | POA: Insufficient documentation

## 2023-03-22 DIAGNOSIS — O26643 Intrahepatic cholestasis of pregnancy, third trimester: Secondary | ICD-10-CM

## 2023-03-22 DIAGNOSIS — E559 Vitamin D deficiency, unspecified: Secondary | ICD-10-CM

## 2023-03-22 DIAGNOSIS — O99113 Other diseases of the blood and blood-forming organs and certain disorders involving the immune mechanism complicating pregnancy, third trimester: Secondary | ICD-10-CM | POA: Insufficient documentation

## 2023-03-22 DIAGNOSIS — Z3A30 30 weeks gestation of pregnancy: Secondary | ICD-10-CM | POA: Diagnosis not present

## 2023-03-22 LAB — URINALYSIS, ROUTINE W REFLEX MICROSCOPIC
Bilirubin Urine: NEGATIVE
Glucose, UA: NEGATIVE mg/dL
Hgb urine dipstick: NEGATIVE
Ketones, ur: NEGATIVE mg/dL
Nitrite: NEGATIVE
Protein, ur: NEGATIVE mg/dL
Specific Gravity, Urine: 1.009 (ref 1.005–1.030)
pH: 7 (ref 5.0–8.0)

## 2023-03-22 LAB — GLUCOSE, CAPILLARY: Glucose-Capillary: 107 mg/dL — ABNORMAL HIGH (ref 70–99)

## 2023-03-22 MED ORDER — METOCLOPRAMIDE HCL 10 MG PO TABS: 10.0000 mg | ORAL_TABLET | Freq: Three times a day (TID) | ORAL | 1 refills | Status: DC | PRN

## 2023-03-22 MED ORDER — DIPHENHYDRAMINE HCL 25 MG PO CAPS
25.0000 mg | ORAL_CAPSULE | Freq: Once | ORAL | Status: AC
  Administered 2023-03-22: 25 mg via ORAL
  Filled 2023-03-22: qty 1

## 2023-03-22 MED ORDER — ACETAMINOPHEN 500 MG PO TABS
1000.0000 mg | ORAL_TABLET | Freq: Once | ORAL | Status: AC
  Administered 2023-03-22: 1000 mg via ORAL
  Filled 2023-03-22: qty 2

## 2023-03-22 MED ORDER — METOCLOPRAMIDE HCL 10 MG PO TABS
10.0000 mg | ORAL_TABLET | Freq: Once | ORAL | Status: AC
  Administered 2023-03-22: 10 mg via ORAL
  Filled 2023-03-22: qty 1

## 2023-03-22 MED ORDER — DIPHENHYDRAMINE HCL 25 MG PO CAPS: 25.0000 mg | ORAL_CAPSULE | Freq: Four times a day (QID) | ORAL | 1 refills | Status: DC | PRN

## 2023-03-22 NOTE — Progress Notes (Signed)
   PRENATAL VISIT NOTE  Subjective:  Erin Mack is a 17 y.o. G1P0000 at [redacted]w[redacted]d being seen today for ongoing prenatal care.  She is currently monitored for the following issues for this high-risk pregnancy and has Asthma; Supervision of high risk pregnancy, antepartum, third trimester; Vitamin D  deficiency; Sickle cell trait (HCC); Gestational thrombocytopenia (HCC); GDM (gestational diabetes mellitus); Cholestasis of pregnancy in third trimester; and Alpha thalassemia silent carrier on their problem list.  Patient reports no bleeding, no contractions, no cramping, and no leaking.  Contractions: Irritability. Vag. Bleeding: None.  Movement: Present. Denies leaking of fluid.   The following portions of the patient's history were reviewed and updated as appropriate: allergies, current medications, past family history, past medical history, past social history, past surgical history and problem list.   Objective:   Vitals:   03/22/23 1613  BP: 100/70  Pulse: 99  Weight: 198 lb 12.8 oz (90.2 kg)    Fetal Status: Fetal Heart Rate (bpm): 154   Movement: Present     General:  Alert, oriented and cooperative. Patient is in no acute distress.  Skin: Skin is warm and dry. No rash noted.   Cardiovascular: Normal heart rate noted  Respiratory: Normal respiratory effort, no problems with respiration noted  Abdomen: Soft, gravid, appropriate for gestational age.  Pain/Pressure: Present     Pelvic: Cervical exam deferred        Extremities: Normal range of motion.  Edema: None  Mental Status: Normal mood and affect. Normal behavior. Normal judgment and thought content.    Assessment and Plan:  Pregnancy: G1P0000 at [redacted]w[redacted]d 1. Supervision of high risk pregnancy, antepartum, third trimester (Primary) FHR and BP appropriate today  2. Sickle cell trait [facility]) Patient planning on seeing specialist in the postpartum period  3. Gestational diabetes mellitus (GDM), antepartum, gestational diabetes  method of control unspecified Blood sugars well-controlled.  She had for elevated blood sugars out of all of her checks.  Will continue to monitor. Ultrasound scheduled 03/31/2023 Next prenatal visit 04/06/2023  4. Vitamin D  deficiency On OTC supplements  5. [redacted] weeks gestation of pregnancy  6. 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 Patient had bile acids and CMP which were normal on that day.  Patient's itching was everywhere except for hands and feet.  Will continue to check bile acids and CMP monthly.  If she develops palmar/foot itching that may change when the testing occurs.  Preterm labor symptoms and general obstetric precautions including but not limited to vaginal bleeding, contractions, leaking of fluid and fetal movement were reviewed in detail with the patient. Please refer to After Visit Summary for other counseling recommendations.   No follow-ups on file.  Future Appointments  Date Time Provider Department Center  03/31/2023  3:15 PM [facility] NURSE [facility]  03/31/2023  3:30 PM [facility] US3 [facility]  04/06/2023  3:55 PM Eldonna Suzen Octave, MD [facility]  04/20/2023  3:55 PM Eldonna Suzen Octave, MD [facility]    Norleen LULLA Rover, MD

## 2023-03-22 NOTE — MAU Provider Note (Signed)
 MAU Provider Note  Chief Complaint: Headache   Event Date/Time   First Provider Initiated Contact with Patient 03/22/23 2115      SUBJECTIVE HPI: Erin Mack is a 17 y.o. G1P0000 at [redacted]w[redacted]d by early ultrasound who presents to maternity admissions reporting headache. Pregnancy c/b A1GDM, cholestasis, gestational thrombocytopenia, sickle cell trait. Receives [facility] with [facility].  Patient notes having a breakdown earlier this evening. Mom called EMS. Now has concern for headache. On her R temple. On and off for the past few days. Usually goes away on its own and did not today. Has not taken any medications. Denies photophobia, phonophobia. Denies any issues with BP this pregnancy. Did have an elevated sugar 156 by EMS.  HPI  Past Medical History:  Diagnosis Date   Asthma    Chlamydia    Gonorrhea    Sickle cell trait (HCC)    Past Surgical History:  Procedure Laterality Date   NO PAST SURGERIES     Social History   Socioeconomic History   Marital status: Single    Spouse name: Not on file   Number of children: Not on file   Years of education: Not on file   Highest education level: Not on file  Occupational History   Not on file  Tobacco Use   Smoking status: Never    Passive exposure: Never   Smokeless tobacco: Never  Vaping Use   Vaping status: Former  Substance and Sexual Activity   Alcohol use: Not Currently    Comment: occasionally   Drug use: Not Currently    Types: Marijuana    Comment: last used a few months prior to pregnancy   Sexual activity: Yes    Birth control/protection: None  Other Topics Concern   Not on file  Social History Narrative   Not on file   Social Drivers of Health   Financial Resource Strain: Not on file  Food Insecurity: Food Insecurity Present (03/02/2023)   Hunger Vital Sign    Worried About Running Out of Food in the Last Year: Sometimes true    Ran Out of Food in the Last Year: Never true  Transportation Needs: Not on file   Physical Activity: Not on file  Stress: Not on file  Social Connections: Not on file  Intimate Partner Violence: Not on file   No current facility-administered medications on file prior to encounter.   Current Outpatient Medications on File Prior to Encounter  Medication Sig Dispense Refill   Prenatal Vit-Fe Fumarate-FA (MULTIVITAMIN-PRENATAL) 27-0.8 MG TABS tablet Take 1 tablet by mouth daily at 12 noon. 30 tablet 12   Accu-Chek Softclix Lancets lancets Check glucose four times a day 100 each 12   Acetaminophen  (TYLENOL  PO) Take by mouth as needed.     ALBUTEROL  IN Inhale into the lungs. (Patient not taking: Reported on 03/22/2023)     Blood Glucose Monitoring Suppl (ACCU-CHEK GUIDE) w/Device KIT 1 kit by Does not apply route in the morning, at noon, in the evening, and at bedtime. 1 kit 0   cyclobenzaprine  (FLEXERIL ) 10 MG tablet Take 1 tablet (10 mg total) by mouth 2 (two) times daily as needed for muscle spasms. (Patient not taking: Reported on 03/22/2023) 20 tablet 1   glucose blood test strip Check blood glucose four times a day 100 each 12   Allergies  Allergen Reactions   Black Walnut Pollen Allergy Skin Test Shortness Of Breath, Itching and Other (See Comments)    Mouth itches   Charentais  Melon (French Melon) Shortness Of Breath, Itching, Nausea Only and Other (See Comments)    Mouth itches   Justicia Adhatoda (Malabar Nut Tree) [Justicia Adhatoda] Shortness Of Breath, Itching and Other (See Comments)    Mouth itches   Sesame Oil Shortness Of Breath, Itching and Other (See Comments)    Mouth itches   Watermelon [Citrullus Vulgaris] Shortness Of Breath, Itching and Other (See Comments)    Mouth itches   Aloe Rash and Other (See Comments)    Made the skin burn    ROS:  Pertinent positives/negatives listed above.  I have reviewed patient's Past Medical Hx, Surgical Hx, Family Hx, Social Hx, medications and allergies.   Physical Exam  Patient Vitals for the past 24  hrs:  BP Temp Pulse Resp SpO2 Height Weight  03/22/23 2218 -- -- -- 18 -- -- --  03/22/23 2024 126/67 -- -- -- -- -- --  03/22/23 2022 -- (!) 97.4 F (36.3 C) (!) 120 17 100 % 5' 5 (1.651 m) 90.7 kg   Constitutional: Well-developed, well-nourished female in no acute distress  Cardiovascular: normal rate Respiratory: normal effort GI: Abd soft, non-tender MS: Extremities nontender, no edema, normal ROM Neurologic: Alert and oriented x 4. No gross abnormalities  FHT:  Baseline 150, moderate variability, accelerations present, no decelerations Contractions: none  LAB RESULTS Results for orders placed or performed during the hospital encounter of 03/22/23 (from the past 24 hours)  Urinalysis, Routine w reflex microscopic -Urine, Clean Catch     Status: Abnormal   Collection Time: 03/22/23  8:25 PM  Result Value Ref Range   Color, Urine YELLOW YELLOW   APPearance HAZY (A) CLEAR   Specific Gravity, Urine 1.009 1.005 - 1.030   pH 7.0 5.0 - 8.0   Glucose, UA NEGATIVE NEGATIVE mg/dL   Hgb urine dipstick NEGATIVE NEGATIVE   Bilirubin Urine NEGATIVE NEGATIVE   Ketones, ur NEGATIVE NEGATIVE mg/dL   Protein, ur NEGATIVE NEGATIVE mg/dL   Nitrite NEGATIVE NEGATIVE   Leukocytes,Ua SMALL (A) NEGATIVE   RBC / HPF 0-5 0 - 5 RBC/hpf   WBC, UA 0-5 0 - 5 WBC/hpf   Bacteria, UA RARE (A) NONE SEEN   Squamous Epithelial / HPF 0-5 0 - 5 /HPF   Mucus PRESENT   Glucose, capillary     Status: Abnormal   Collection Time: 03/22/23  9:06 PM  Result Value Ref Range   Glucose-Capillary 107 (H) 70 - 99 mg/dL    O/Positive/-- (91/78 1151)  IMAGING US  MFM OB FOLLOW UP Result Date: 03/03/2023 ----------------------------------------------------------------------  OBSTETRICS REPORT                       (Signed Final 03/03/2023 04:44 pm) ---------------------------------------------------------------------- Patient Info  ID #:       [id number]                          D.O.B.:  16-May-2005 (17 yrs)(F)  Name:        Erin Mack                    Visit Date: 03/03/2023 04:02 pm ---------------------------------------------------------------------- Performed By  Attending:        Delora Smaller DO       Ref. Address:     [address]  [place], [place]                                                             [address]  Performed By:     Joyann Allen RDMS      [facility]  Referred By:      [facility] ---------------------------------------------------------------------- Orders  #  Description                           Code        Ordered By  1  US  MFM OB FOLLOW UP                   [phone]    BABARA KEYS ----------------------------------------------------------------------  #  Order #                     Accession #                Episode #  1  [id number]                   [id number]                 [id number] ---------------------------------------------------------------------- Indications  Gestational diabetes in pregnancy, diet        O24.410  controlled  Teen pregnancy                                 O75.89  History of sickle cell trait                   Z86.2  Cholestasis of pregnancy, second trimester     O26.612K83.1  [redacted] weeks gestation of pregnancy                Z3A.28  LR NIPS - Female, Negative AFP ---------------------------------------------------------------------- Fetal Evaluation  Num Of Fetuses:  1  Fetal Heart Rate(bpm):  150  Cardiac Activity:       Observed  Presentation:           Cephalic  Placenta:               Posterior  P. Cord Insertion:      Previously seen  Amniotic Fluid  AFI FV:      Within normal limits  AFI Sum(cm)     %Tile       Largest Pocket(cm)  13.31            39          3.71  RUQ(cm)       RLQ(cm)       LUQ(cm)        LLQ(cm)  3.26          3.71          3.39           2.95 ---------------------------------------------------------------------- Biometry  BPD:      71.6  mm     G. Age:  28w 5d         58  %    CI:        75.45   %    70 - 86                                                          FL/HC:      21.0   %    18.8 - 20.6  HC:      261.4  mm     G. Age:  28w 3d         27  %    HC/AC:      1.06        1.05 - 1.21  AC:      246.7  mm     G. Age:  28w 6d         66  %    FL/BPD:     76.5   %    71 - 87  FL:       54.8  mm     G. Age:  29w 0d         59  %    FL/AC:      22.2   %    20 - 24  LV:        3.5  mm  Est. FW:    1295  gm    2 lb 14 oz      65  % ---------------------------------------------------------------------- OB History  Gravidity:    1 ---------------------------------------------------------------------- Gestational Age  LMP:           30w 0d        Date:  08/05/22                  EDD:   05/12/23  U/S Today:     28w 5d                                        EDD:   05/21/23  Best:          caswell  1d     Det. By:  U/S C R L  (10/20/22)    EDD:   05/25/23 ---------------------------------------------------------------------- Anatomy  Cranium:               Previously seen        Aortic Arch:            Previously seen  Cavum:                 Previously seen        Ductal Arch:            Previously seen  Ventricles:            Appears normal         Diaphragm:              Appears normal  Choroid Plexus:        Previously seen        Stomach:                Appears normal, left                                                                        sided  Cerebellum:            Previously seen        Abdomen:                Previously seen  Posterior Fossa:       Previously seen        Abdominal Wall:         Previously seen  Face:                  Orbits and profile     Cord Vessels:           Previously seen                         previously seen  Lips:                   Previously seen        Kidneys:                Appear normal  Thoracic:              Previously seen        Bladder:                Appears normal  Heart:                 Appears normal         Spine:                  Previously seen                         (4CH, axis, and                         situs)  RVOT:                  Previously seen  Upper Extremities:      Previously seen  LVOT:                  Previously seen        Lower Extremities:      Previously seen ---------------------------------------------------------------------- Cervix Uterus Adnexa  Cervix  Not visualized (advanced GA >24wks)  Uterus  No abnormality visualized.  Right Ovary  Not visualized.  Left Ovary  Not visualized.  Cul De Sac  No free fluid seen.  Adnexa  No abnormality visualized ---------------------------------------------------------------------- Comments  MFM CONSULT  Jazzlin Strassman is a 17 y.o. G1P0000 at [redacted]w[redacted]d here for  ultrasound and consultation.  The patient is here for ultrasound for newly diagnosed GDM.  EDD: 05/25/2023 dated by U/S C R L  (10/20/22).  Her  pregnancy is complicated by newly diagnosed GDM and has  1 day of blood glucose values and all of them are at goal.  She understands the potential complications associated with  gestational diabetes in pregnancy such as growth  abnormalities as well as the importance of good glucose  control.  The patient reports new onset itching that is worse at night on  her hands feet and abdomen.  Her mother, who is with her  today, reports that she had cholestasis of pregnancy and is  concerned about her daughter having it as well.  I discussed  that this is a clinical diagnosis and most likely what she has.  Will order blood work today to confirm.  I discussed the  concern for spontaneous stillbirth in the setting of cholestasis  especially with high bile acid levels.  I also discussed need  for antenatal testing to start around 32 weeks unless the bile   acids are very high.  If they are over 100 then antenatal  testing should start within the week of diagnosis.  Sonographic findings  Single intrauterine pregnancy at 28w 1d.  Fetal cardiac activity:  Observed and appears normal.  Presentation: Cephalic.  Interval fetal anatomy appears normal.  Fetal biometry shows the estimated fetal weight at the 65  percentile.  Amniotic fluid volume: Within normal limits. MVP: 3.71 cm.  Placenta: Posterior.  Normal fetal movement and tone.  Patient Active Problem List           Diagnosis         Date Noted          Cholestasis of pregnancy in third trimester  trimester12/02/2023          Gestational thrombocytopenia (HCC)  (HCC)    02/23/2023          GDM (gestational diabetes mellitus) 02/23/2023          Sickle cell trait (HCC)    11/10/2022          Asthma   10/19/2022          Supervision of low-risk pregnancy   10/19/2022          Vitamin D  deficiency       05/08/2021          Low TSH level     05/08/2021  Recommendations  1. Serial growth ultrasounds every 4 weeks until delivery  2.  Antenatal testing is only indicated if she requires  medication to control her blood glucose  3.  CMP and bile acids due to concern for cholestasis.  Regardless of the bile acid level this is a clinical diagnosis  and should be managed accordingly.  Repeat bile acids  every  month are important to help stratify delivery timing.  4.  CBC every week to assess thrombocytopenia ----------------------------------------------------------------------                   Delora Smaller, DO Electronically Signed Final Report   03/03/2023 04:44 pm ----------------------------------------------------------------------    MAU Management/MDM: Orders Placed This Encounter  Procedures   Urinalysis, Routine w reflex microscopic -Urine, Clean Catch   Glucose, capillary   Discharge patient    Meds ordered this encounter  Medications   acetaminophen  (TYLENOL ) tablet 1,000 mg   diphenhydrAMINE  (BENADRYL )  capsule 25 mg   metoCLOPramide  (REGLAN ) tablet 10 mg   diphenhydrAMINE  (BENADRYL ) 25 mg capsule    Sig: Take 1 capsule (25 mg total) by mouth every 6 (six) hours as needed (headache).    Dispense:  60 capsule    Refill:  1   metoCLOPramide  (REGLAN ) 10 MG tablet    Sig: Take 1 tablet (10 mg total) by mouth every 8 (eight) hours as needed (headache).    Dispense:  60 tablet    Refill:  1     Available prenatal records reviewed.  Patient's primary concerns today are headache and elevated blood sugar. Headache sounds to be migrainous in nature. Reassuringly does not have elevated BP or other preE symptoms to warrant work-up. Headache resolved with tylenol , benadryl , reglan ; these were provided for home.  Repeat CBG here appropriate.  FWB: Reactive NST for gestation.  ASSESSMENT 1. Pregnancy headache in third trimester   2. Diet controlled gestational diabetes mellitus (GDM) in third trimester   3. [redacted] weeks gestation of pregnancy     PLAN Discharge home with strict return precautions. Allergies as of 03/22/2023       Reactions   Black Walnut Pollen Allergy Skin Test Shortness Of Breath, Itching, Other (See Comments)   Mouth itches   Charentais Melon (french Melon) Shortness Of Breath, Itching, Nausea Only, Other (See Comments)   Mouth itches   Justicia Adhatoda (malabar Nut Tree) [justicia Adhatoda] Shortness Of Breath, Itching, Other (See Comments)   Mouth itches   Sesame Oil Shortness Of Breath, Itching, Other (See Comments)   Mouth itches   Watermelon [citrullus Vulgaris] Shortness Of Breath, Itching, Other (See Comments)   Mouth itches   Aloe Rash, Other (See Comments)   Made the skin burn        Medication List     TAKE these medications    Accu-Chek Guide w/Device Kit 1 kit by Does not apply route in the morning, at noon, in the evening, and at bedtime.   Accu-Chek Softclix Lancets lancets Check glucose four times a day   ALBUTEROL  IN Inhale into the  lungs.   cyclobenzaprine  10 MG tablet Commonly known as: FLEXERIL  Take 1 tablet (10 mg total) by mouth 2 (two) times daily as needed for muscle spasms.   diphenhydrAMINE  25 mg capsule Commonly known as: BENADRYL  Take 1 capsule (25 mg total) by mouth every 6 (six) hours as needed (headache). What changed:  medication strength how much to take when to take this reasons to take this   glucose blood test strip Check blood glucose four times a day   metoCLOPramide  10 MG tablet Commonly known as: REGLAN  Take 1 tablet (10 mg total) by mouth every 8 (eight) hours as needed (headache).   multivitamin-prenatal 27-0.8 MG Tabs tablet Take 1 tablet by mouth daily at 12 noon.   TYLENOL  PO Take by mouth as needed.  Almarie Moats, MD OB Fellow 03/22/2023  10:37 PM

## 2023-03-22 NOTE — Discharge Instructions (Signed)
Safe Medications in Pregnancy   Acne: Benzoyl Peroxide Salicylic Acid  Backache/Headache: Tylenol: 2 regular strength every 4 hours OR              2 Extra strength every 6 hours  Colds/Coughs/Allergies: Benadryl (alcohol free) 25 mg every 6 hours as needed Breath right strips Claritin Cepacol throat lozenges Chloraseptic throat spray Cold-Eeze- up to three times per day Cough drops, alcohol free Flonase (by prescription only) Guaifenesin Mucinex Robitussin DM (plain only, alcohol free) Saline nasal spray/drops Sudafed (pseudoephedrine) & Actifed ** use only after [redacted] weeks gestation and if you do not have high blood pressure Tylenol Vicks Vaporub Zinc lozenges Zyrtec   Constipation: Colace Ducolax suppositories Fleet enema Glycerin suppositories Metamucil Milk of magnesia Miralax Senokot Smooth move tea  Diarrhea: Kaopectate Imodium A-D  *NO pepto Bismol  Hemorrhoids: Anusol Anusol HC Preparation H Tucks  Indigestion: Tums Maalox Mylanta Zantac  Pepcid  Insomnia: Benadryl (alcohol free) 25mg  every 6 hours as needed Tylenol PM Unisom, no Gelcaps  Leg Cramps: Tums MagGel  Nausea/Vomiting:  Bonine Dramamine Emetrol Ginger extract Sea bands Meclizine  Nausea medication to take during pregnancy:  Unisom (doxylamine succinate 25 mg tablets) Take one tablet daily at bedtime. If symptoms are not adequately controlled, the dose can be increased to a maximum recommended dose of two tablets daily (1/2 tablet in the morning, 1/2 tablet mid-afternoon and one at bedtime). Vitamin B6 100mg  tablets. Take one tablet twice a day (up to 200 mg per day).  Skin Rashes: Aveeno products Benadryl cream or 25mg  every 6 hours as needed Calamine Lotion 1% cortisone cream  Yeast infection: Gyne-lotrimin 7 Monistat 7   **If taking multiple medications, please check labels to avoid duplicating the same active ingredients **take medication as directed on  the label ** Do not exceed 3000 mg of tylenol in 24 hours **Do not take medications that contain aspirin or ibuprofen

## 2023-03-22 NOTE — MAU Note (Signed)
.  Erin Mack is a 17 y.o. at [redacted]w[redacted]d here in MAU reporting calling an ambulance to my house as I was having a breakdown and my mom called EMS. Pt's blood sugar was 156 and pt did not want to come by EMS. Pt wants and u/s next wk but she cannot make appt as her mom will be out of town for that appt. Has had a h/a since 1600 which comes and goes. Has not taken anything for it. Reports good FM. GDM diet controlled  LMP: n/a Onset of complaint: 1600 Pain score: 6 Vitals:   03/22/23 2022 03/22/23 2024  BP:  126/67  Pulse: (!) 120   Resp: 17   Temp: (!) 97.4 F (36.3 C)   SpO2: 100%      FHT:156 Lab orders placed from triage: u/a

## 2023-03-23 NOTE — L&D Delivery Note (Signed)
OB/GYN Faculty Practice Delivery Note  Erin Mack is a 18 y.o. G1P0000 s/p SVD at [redacted]w[redacted]d. She was admitted for IOL for severe preeclampsia, A1 GDM.   ROM: 15h 71m with clear fluid GBS Status:  Negative/-- (02/04 0204) Maximum Maternal Temperature:  Temp (24hrs), Avg:98.6 F (37 C), Min:97.9 F (36.6 C), Max:99.2 F (37.3 C)    Labor Progress: Patient arrived at 2 cm dilation and was induced with Pitocin.   Delivery Date/Time: 05/10/2023 at 1449 Delivery: Called to room and patient was complete and pushing.  Hands on hands delivery with the father of the baby.  Head delivered in LOA position. No nuchal cord present. Shoulder and body delivered in usual fashion. Infant with spontaneous cry, placed on mother's abdomen, dried and stimulated. Cord clamped x 2 after 1-minute delay, and cut by FOB. Cord blood drawn. Placenta delivered spontaneously with gentle cord traction. Fundus firm with massage and Pitocin. Labia, perineum, vagina, and cervix inspected with first-degree perineal laceration which was repaired with 3-0 Vicryl suture.  There were periclitoral as well as left labial abrasions which were hemostatic and not repaired.   Placenta:  spontaneous, intact, 3 vessel cord  Complications: None Lacerations: First-degree perineal which was repaired, multiple abrasions EBL: 426 mL Analgesia: Epidural  Infant: APGAR (1 MIN): 9  APGAR (5 MINS): 9  APGAR (10 MINS):    Weight: Pending  Derrel Nip, MD Attending Family Medicine Physician, [facility] [place], [facility]  05/10/2023 3:49 PM

## 2023-03-24 LAB — CERVICOVAGINAL ANCILLARY ONLY
Bacterial Vaginitis (gardnerella): POSITIVE — AB
Candida Glabrata: NEGATIVE
Candida Vaginitis: POSITIVE — AB
Chlamydia: NEGATIVE
Comment: NEGATIVE
Comment: NEGATIVE
Comment: NEGATIVE
Comment: NEGATIVE
Comment: NEGATIVE
Comment: NORMAL
Neisseria Gonorrhea: NEGATIVE
Trichomonas: NEGATIVE

## 2023-03-25 MED ORDER — MICONAZOLE NITRATE 2 % VA CREA: 1.0000 | TOPICAL_CREAM | Freq: Every day | VAGINAL | 0 refills | Status: AC

## 2023-03-25 MED ORDER — METRONIDAZOLE 500 MG PO TABS: 500.0000 mg | ORAL_TABLET | Freq: Two times a day (BID) | ORAL | 0 refills | Status: DC

## 2023-03-25 NOTE — Addendum Note (Signed)
 Addended by: Celedonio Savage on: 03/25/2023 10:17 AM   Modules accepted: Orders

## 2023-03-26 ENCOUNTER — Other Ambulatory Visit: Payer: Self-pay | Admitting: Family Medicine

## 2023-03-26 DIAGNOSIS — O24419 Gestational diabetes mellitus in pregnancy, unspecified control: Secondary | ICD-10-CM

## 2023-03-28 MED ORDER — GLUCOSE BLOOD VI STRP
ORAL_STRIP | 3 refills | Status: DC
Start: 2023-03-28 — End: 2023-05-12

## 2023-03-31 ENCOUNTER — Other Ambulatory Visit: Payer: Self-pay

## 2023-03-31 ENCOUNTER — Ambulatory Visit: Payer: Medicaid Other | Attending: Maternal & Fetal Medicine | Admitting: *Deleted

## 2023-03-31 ENCOUNTER — Ambulatory Visit: Payer: Medicaid Other

## 2023-03-31 ENCOUNTER — Other Ambulatory Visit: Payer: Self-pay | Admitting: *Deleted

## 2023-03-31 VITALS — BP 126/63 | HR 115

## 2023-03-31 DIAGNOSIS — O24410 Gestational diabetes mellitus in pregnancy, diet controlled: Secondary | ICD-10-CM | POA: Diagnosis not present

## 2023-03-31 DIAGNOSIS — O26642 Intrahepatic cholestasis of pregnancy, second trimester: Secondary | ICD-10-CM | POA: Diagnosis not present

## 2023-03-31 DIAGNOSIS — Z3A32 32 weeks gestation of pregnancy: Secondary | ICD-10-CM

## 2023-03-31 DIAGNOSIS — O24419 Gestational diabetes mellitus in pregnancy, unspecified control: Secondary | ICD-10-CM | POA: Diagnosis present

## 2023-03-31 DIAGNOSIS — O99013 Anemia complicating pregnancy, third trimester: Secondary | ICD-10-CM | POA: Insufficient documentation

## 2023-03-31 DIAGNOSIS — D573 Sickle-cell trait: Secondary | ICD-10-CM | POA: Diagnosis not present

## 2023-03-31 DIAGNOSIS — K831 Obstruction of bile duct: Secondary | ICD-10-CM

## 2023-03-31 DIAGNOSIS — O0993 Supervision of high risk pregnancy, unspecified, third trimester: Secondary | ICD-10-CM

## 2023-04-06 ENCOUNTER — Ambulatory Visit: Payer: Medicaid Other | Admitting: Family Medicine

## 2023-04-06 VITALS — BP 111/78 | HR 111 | Wt 198.4 lb

## 2023-04-06 DIAGNOSIS — O24410 Gestational diabetes mellitus in pregnancy, diet controlled: Secondary | ICD-10-CM

## 2023-04-06 DIAGNOSIS — Z3A33 33 weeks gestation of pregnancy: Secondary | ICD-10-CM

## 2023-04-06 DIAGNOSIS — D573 Sickle-cell trait: Secondary | ICD-10-CM

## 2023-04-06 DIAGNOSIS — O26643 Intrahepatic cholestasis of pregnancy, third trimester: Secondary | ICD-10-CM

## 2023-04-06 DIAGNOSIS — D696 Thrombocytopenia, unspecified: Secondary | ICD-10-CM

## 2023-04-06 DIAGNOSIS — O0993 Supervision of high risk pregnancy, unspecified, third trimester: Secondary | ICD-10-CM

## 2023-04-06 DIAGNOSIS — O99113 Other diseases of the blood and blood-forming organs and certain disorders involving the immune mechanism complicating pregnancy, third trimester: Secondary | ICD-10-CM

## 2023-04-06 DIAGNOSIS — D563 Thalassemia minor: Secondary | ICD-10-CM

## 2023-04-06 NOTE — Progress Notes (Signed)
   PRENATAL VISIT NOTE  Subjective:  Erin Mack is a 18 y.o. G1P0000 at [redacted]w[redacted]d being seen today for ongoing prenatal care.  She is currently monitored for the following issues for this high-risk pregnancy and has Asthma; Supervision of high risk pregnancy, antepartum, third trimester; Vitamin D  deficiency; Sickle cell trait (HCC); Gestational thrombocytopenia (HCC); GDM (gestational diabetes mellitus); Cholestasis of pregnancy in third trimester; and Alpha thalassemia silent carrier on their problem list.  Patient reports no complaints.  Contractions: Irritability. Vag. Bleeding: None.  Movement: Present. Denies leaking of fluid.   The following portions of the patient's history were reviewed and updated as appropriate: allergies, current medications, past family history, past medical history, past social history, past surgical history and problem list.   Objective:   Vitals:   04/06/23 1634  BP: 111/78  Pulse: (!) 111  Weight: 198 lb 6.4 oz (90 kg)    Fetal Status: Fetal Heart Rate (bpm): 152   Movement: Present     General:  Alert, oriented and cooperative. Patient is in no acute distress.  Skin: Skin is warm and dry. No rash noted.   Cardiovascular: Normal heart rate noted  Respiratory: Normal respiratory effort, no problems with respiration noted  Abdomen: Soft, gravid, appropriate for gestational age.  Pain/Pressure: Present     Pelvic: Cervical exam deferred        Extremities: Normal range of motion.  Edema: Trace  Mental Status: Normal mood and affect. Normal behavior. Normal judgment and thought content.   Assessment and Plan:  Pregnancy: G1P0000 at [redacted]w[redacted]d 1. Diet controlled gestational diabetes mellitus (GDM) in third trimester (Primary) [place] 80-90s 2h PP mostly under 120, one greater Growth US  are scheduled wit MFM- next 2/6      2. Cholestasis of pregnancy in third trimester Diagnosis clinically by MFM 12/12 Itching present just on her hands today, reports  itching on soles of feet at night for the past week Will get bile acids and LFT, lab only visit will be scheduled  3. Benign gestational thrombocytopenia in third trimester (HCC) Recheck CBC   4. Supervision of high risk pregnancy, antepartum, third trimester Up to date No concerns today FH appropriate Vigorous movement Reports yeast infection- did not take Monostat. Will call pharmacy. Discussed OTC treatment  Preterm labor symptoms and general obstetric precautions including but not limited to vaginal bleeding, contractions, leaking of fluid and fetal movement were reviewed in detail with the patient. Please refer to After Visit Summary for other counseling recommendations.   Return in about 2 weeks (around 04/20/2023) for Routine prenatal care.  Future Appointments  Date Time Provider Department Center  04/07/2023  1:50 PM [facility]  04/20/2023  3:55 PM Abner Ables, MD [facility]  04/28/2023  3:15 PM [facility] NURSE [facility]  04/28/2023  3:30 PM [facility] US5 [facility]    Abner Ables, MD

## 2023-04-07 ENCOUNTER — Other Ambulatory Visit: Payer: Self-pay

## 2023-04-07 ENCOUNTER — Other Ambulatory Visit: Payer: Medicaid Other

## 2023-04-07 DIAGNOSIS — O99113 Other diseases of the blood and blood-forming organs and certain disorders involving the immune mechanism complicating pregnancy, third trimester: Secondary | ICD-10-CM

## 2023-04-07 DIAGNOSIS — O26643 Intrahepatic cholestasis of pregnancy, third trimester: Secondary | ICD-10-CM

## 2023-04-08 LAB — COMPREHENSIVE METABOLIC PANEL
ALT: 39 [IU]/L — ABNORMAL HIGH (ref 0–32)
AST: 26 [IU]/L (ref 0–40)
Albumin: 3.7 g/dL — ABNORMAL LOW (ref 4.0–5.0)
Alkaline Phosphatase: 156 [IU]/L — ABNORMAL HIGH (ref 42–106)
BUN/Creatinine Ratio: 7 — ABNORMAL LOW (ref 9–23)
BUN: 4 mg/dL — ABNORMAL LOW (ref 6–20)
Bilirubin Total: 0.3 mg/dL (ref 0.0–1.2)
CO2: 18 mmol/L — ABNORMAL LOW (ref 20–29)
Calcium: 9.2 mg/dL (ref 8.7–10.2)
Chloride: 105 mmol/L (ref 96–106)
Creatinine, Ser: 0.59 mg/dL (ref 0.57–1.00)
Globulin, Total: 2.1 g/dL (ref 1.5–4.5)
Glucose: 79 mg/dL (ref 70–99)
Potassium: 4.3 mmol/L (ref 3.5–5.2)
Sodium: 136 mmol/L (ref 134–144)
Total Protein: 5.8 g/dL — ABNORMAL LOW (ref 6.0–8.5)
eGFR: 133 mL/min/{1.73_m2} (ref >59)

## 2023-04-08 LAB — BILE ACIDS, TOTAL: Bile Acids Total: 1.3 umol/L (ref 0.0–10.0)

## 2023-04-08 LAB — CBC
Hematocrit: 33.3 % — ABNORMAL LOW (ref 34.0–46.6)
Hemoglobin: 11.1 g/dL (ref 11.1–15.9)
MCH: 26.2 pg — ABNORMAL LOW (ref 26.6–33.0)
MCHC: 33.3 g/dL (ref 31.5–35.7)
MCV: 79 fL (ref 79–97)
Platelets: 115 10*3/uL — ABNORMAL LOW (ref 150–450)
RBC: 4.23 x10E6/uL (ref 3.77–5.28)
RDW: 13.7 % (ref 11.7–15.4)
WBC: 6.5 10*3/uL (ref 3.4–10.8)

## 2023-04-12 ENCOUNTER — Encounter: Payer: Self-pay | Admitting: Family Medicine

## 2023-04-20 ENCOUNTER — Encounter: Payer: Medicaid Other | Admitting: Family Medicine

## 2023-04-26 ENCOUNTER — Ambulatory Visit (INDEPENDENT_AMBULATORY_CARE_PROVIDER_SITE_OTHER): Payer: Medicaid Other | Admitting: Family Medicine

## 2023-04-26 ENCOUNTER — Other Ambulatory Visit (HOSPITAL_COMMUNITY)
Admission: RE | Admit: 2023-04-26 | Discharge: 2023-04-26 | Disposition: A | Discharge status: Home / Self Care | Payer: Medicaid Other | Source: Ambulatory Visit | Attending: Family Medicine | Admitting: Family Medicine

## 2023-04-26 ENCOUNTER — Other Ambulatory Visit: Payer: Self-pay

## 2023-04-26 VITALS — BP 112/80 | HR 111 | Wt 208.2 lb

## 2023-04-26 DIAGNOSIS — O26643 Intrahepatic cholestasis of pregnancy, third trimester: Secondary | ICD-10-CM

## 2023-04-26 DIAGNOSIS — D696 Thrombocytopenia, unspecified: Secondary | ICD-10-CM

## 2023-04-26 DIAGNOSIS — O99113 Other diseases of the blood and blood-forming organs and certain disorders involving the immune mechanism complicating pregnancy, third trimester: Secondary | ICD-10-CM | POA: Diagnosis not present

## 2023-04-26 DIAGNOSIS — O0993 Supervision of high risk pregnancy, unspecified, third trimester: Secondary | ICD-10-CM

## 2023-04-26 DIAGNOSIS — D563 Thalassemia minor: Secondary | ICD-10-CM

## 2023-04-26 DIAGNOSIS — Z3A35 35 weeks gestation of pregnancy: Secondary | ICD-10-CM

## 2023-04-26 DIAGNOSIS — O24410 Gestational diabetes mellitus in pregnancy, diet controlled: Secondary | ICD-10-CM | POA: Diagnosis not present

## 2023-04-26 NOTE — Progress Notes (Signed)
   PRENATAL VISIT NOTE  Subjective:  Erin Mack is a 18 y.o. G1P0000 at [redacted]w[redacted]d being seen today for ongoing prenatal care.  She is currently monitored for the following issues for this high-risk pregnancy and has Asthma; Supervision of high risk pregnancy, antepartum, third trimester; Vitamin D  deficiency; Sickle cell trait (HCC); Gestational thrombocytopenia (HCC); GDM (gestational diabetes mellitus); Cholestasis of pregnancy in third trimester; and Alpha thalassemia silent carrier on their problem list.  Patient reports no bleeding, no cramping, and no leaking.  Contractions: Irritability. Vag. Bleeding: None.  Movement: Present. Denies leaking of fluid.   The following portions of the patient's history were reviewed and updated as appropriate: allergies, current medications, past family history, past medical history, past social history, past surgical history and problem list.   Objective:   Vitals:   04/26/23 1419  BP: 112/80  Pulse: (!) 111  Weight: 208 lb 3 oz (94.4 kg)    Fetal Status: Fetal Heart Rate (bpm): 153   Movement: Present     General:  Alert, oriented and cooperative. Patient is in no acute distress.  Skin: Skin is warm and dry. No rash noted.   Cardiovascular: Normal heart rate noted  Respiratory: Normal respiratory effort, no problems with respiration noted  Abdomen: Soft, gravid, appropriate for gestational age.  Pain/Pressure: Present     Pelvic: Cervical exam deferred        Extremities: Normal range of motion.  Edema: None  Mental Status: Normal mood and affect. Normal behavior. Normal judgment and thought content.   Assessment and Plan:  Pregnancy: G1P0000 at [redacted]w[redacted]d 1. Supervision of high risk pregnancy, antepartum, third trimester (Primary) FHR and BP appropriate today - Cervicovaginal ancillary only - Culture, beta strep (group b only)  2. Diet controlled gestational diabetes mellitus (GDM) in third trimester Patient does not have blood glucose log but  patient reports blood glucoses are well-controlled on diet.  Fastings blood sugars less than 90 and postprandials less than 120.  Will bring log to next visit.  3. Cholestasis of pregnancy in third trimester This was a clinical diagnosis made by MFM.  Bile acids have been within normal limits with normal LFTs.  Reports itching waxes and wanes.  4. Benign gestational thrombocytopenia in third trimester (HCC) Repeat CBC today - CBC  5. Alpha thalassemia silent carrier  6. [redacted] weeks gestation of pregnancy - Cervicovaginal ancillary only - Culture, beta strep (group b only)  Preterm labor symptoms and general obstetric precautions including but not limited to vaginal bleeding, contractions, leaking of fluid and fetal movement were reviewed in detail with the patient. Please refer to After Visit Summary for other counseling recommendations.   No follow-ups on file.  Future Appointments  Date Time Provider Department Center  04/28/2023  3:15 PM [facility] NURSE [facility]  04/28/2023  3:30 PM [facility] US5 [facility]  05/04/2023  3:35 PM Eldonna Suzen Octave, MD [facility]  05/12/2023  9:55 AM Lola Donnice HERO, MD [facility] 2 [facility]  05/19/2023  9:55 AM Lola Donnice HERO, MD [facility]  05/26/2023  3:15 PM Lola Donnice HERO, MD [facility]  06/02/2023  2:55 PM [facility] US2 [facility]  06/02/2023  3:35 PM Lola, Donnice HERO, MD [facility]    Norleen LULLA Rover, MD

## 2023-04-27 ENCOUNTER — Encounter: Payer: Self-pay | Admitting: Family Medicine

## 2023-04-27 LAB — CERVICOVAGINAL ANCILLARY ONLY
Bacterial Vaginitis (gardnerella): POSITIVE — AB
Candida Glabrata: NEGATIVE
Candida Vaginitis: POSITIVE — AB
Chlamydia: POSITIVE — AB
Comment: NEGATIVE
Comment: NEGATIVE
Comment: NEGATIVE
Comment: NEGATIVE
Comment: NEGATIVE
Comment: NORMAL
Neisseria Gonorrhea: NEGATIVE
Trichomonas: NEGATIVE

## 2023-04-27 LAB — CBC
Hematocrit: 30.3 % — ABNORMAL LOW (ref 34.0–46.6)
Hemoglobin: 10 g/dL — ABNORMAL LOW (ref 11.1–15.9)
MCH: 25.6 pg — ABNORMAL LOW (ref 26.6–33.0)
MCHC: 33 g/dL (ref 31.5–35.7)
MCV: 78 fL — ABNORMAL LOW (ref 79–97)
Platelets: 111 10*3/uL — ABNORMAL LOW (ref 150–450)
RBC: 3.9 x10E6/uL (ref 3.77–5.28)
RDW: 14.3 % (ref 11.7–15.4)
WBC: 6.1 10*3/uL (ref 3.4–10.8)

## 2023-04-27 MED ORDER — FLUCONAZOLE 150 MG PO TABS: 150.0000 mg | ORAL_TABLET | Freq: Once | ORAL | 0 refills | Status: AC

## 2023-04-27 MED ORDER — AZITHROMYCIN 250 MG PO TABS: 1000.0000 mg | ORAL_TABLET | Freq: Once | ORAL | 0 refills | Status: AC

## 2023-04-27 MED ORDER — METRONIDAZOLE 500 MG PO TABS: 500.0000 mg | ORAL_TABLET | Freq: Two times a day (BID) | ORAL | 0 refills | Status: AC

## 2023-04-27 NOTE — Telephone Encounter (Signed)
Her partner does have a chart if you wanted to send in something for him.

## 2023-04-28 ENCOUNTER — Ambulatory Visit: Payer: Medicaid Other | Attending: Obstetrics and Gynecology

## 2023-04-28 ENCOUNTER — Other Ambulatory Visit: Payer: Self-pay

## 2023-04-28 ENCOUNTER — Ambulatory Visit: Payer: Medicaid Other | Admitting: *Deleted

## 2023-04-28 VITALS — BP 124/78

## 2023-04-28 DIAGNOSIS — D573 Sickle-cell trait: Secondary | ICD-10-CM | POA: Insufficient documentation

## 2023-04-28 DIAGNOSIS — O0993 Supervision of high risk pregnancy, unspecified, third trimester: Secondary | ICD-10-CM | POA: Insufficient documentation

## 2023-04-28 DIAGNOSIS — O24419 Gestational diabetes mellitus in pregnancy, unspecified control: Secondary | ICD-10-CM | POA: Insufficient documentation

## 2023-04-28 DIAGNOSIS — O26612 Liver and biliary tract disorders in pregnancy, second trimester: Secondary | ICD-10-CM

## 2023-04-28 DIAGNOSIS — O24410 Gestational diabetes mellitus in pregnancy, diet controlled: Secondary | ICD-10-CM | POA: Diagnosis not present

## 2023-04-28 DIAGNOSIS — Z3A36 36 weeks gestation of pregnancy: Secondary | ICD-10-CM

## 2023-04-28 DIAGNOSIS — K831 Obstruction of bile duct: Secondary | ICD-10-CM

## 2023-04-28 DIAGNOSIS — O99013 Anemia complicating pregnancy, third trimester: Secondary | ICD-10-CM

## 2023-04-29 LAB — CULTURE, BETA STREP (GROUP B ONLY): Strep Gp B Culture: NEGATIVE

## 2023-05-02 ENCOUNTER — Encounter: Payer: Self-pay | Admitting: *Deleted

## 2023-05-02 NOTE — Progress Notes (Signed)
 STD report showing positive for Chlamydia. Confidential report completed and sent to [facility]. Patient was notified by provider via message that patient read. Erin Mack

## 2023-05-04 ENCOUNTER — Ambulatory Visit: Payer: Medicaid Other | Admitting: Family Medicine

## 2023-05-04 ENCOUNTER — Other Ambulatory Visit: Payer: Self-pay

## 2023-05-04 VITALS — BP 141/93 | HR 73 | Wt 212.4 lb

## 2023-05-04 DIAGNOSIS — A749 Chlamydial infection, unspecified: Secondary | ICD-10-CM

## 2023-05-04 DIAGNOSIS — D696 Thrombocytopenia, unspecified: Secondary | ICD-10-CM

## 2023-05-04 DIAGNOSIS — O99113 Other diseases of the blood and blood-forming organs and certain disorders involving the immune mechanism complicating pregnancy, third trimester: Secondary | ICD-10-CM

## 2023-05-04 DIAGNOSIS — Z3A37 37 weeks gestation of pregnancy: Secondary | ICD-10-CM

## 2023-05-04 DIAGNOSIS — O98813 Other maternal infectious and parasitic diseases complicating pregnancy, third trimester: Secondary | ICD-10-CM

## 2023-05-04 DIAGNOSIS — O24410 Gestational diabetes mellitus in pregnancy, diet controlled: Secondary | ICD-10-CM

## 2023-05-04 DIAGNOSIS — O0993 Supervision of high risk pregnancy, unspecified, third trimester: Secondary | ICD-10-CM

## 2023-05-04 DIAGNOSIS — O26643 Intrahepatic cholestasis of pregnancy, third trimester: Secondary | ICD-10-CM

## 2023-05-04 NOTE — Progress Notes (Signed)
   PRENATAL VISIT NOTE  Subjective:  Erin Mack is a 18 y.o. G1P0000 at [redacted]w[redacted]d being seen today for ongoing prenatal care.  She is currently monitored for the following issues for this high-risk pregnancy and has Asthma; Supervision of high risk pregnancy, antepartum, third trimester; Vitamin D deficiency; Sickle cell trait (HCC); Gestational thrombocytopenia (HCC); GDM (gestational diabetes mellitus); Cholestasis of pregnancy in third trimester; and Alpha thalassemia silent carrier on their problem list.  Patient reports no complaints.  Contractions: Irritability. Vag. Bleeding: None.  Movement: Present. Denies leaking of fluid.   The following portions of the patient's history were reviewed and updated as appropriate: allergies, current medications, past family history, past medical history, past social history, past surgical history and problem list.   Objective:   Vitals:   05/04/23 1547  BP: (!) 141/93  Pulse: 73  Weight: 212 lb 6 oz (96.3 kg)    Fetal Status: Fetal Heart Rate (bpm): 148   Movement: Present     General:  Alert, oriented and cooperative. Patient is in no acute distress.  Skin: Skin is warm and dry. No rash noted.   Cardiovascular: Normal heart rate noted  Respiratory: Normal respiratory effort, no problems with respiration noted  Abdomen: Soft, gravid, appropriate for gestational age.  Pain/Pressure: Present     Pelvic: Cervical exam deferred        Extremities: Normal range of motion.  Edema: None  Mental Status: Normal mood and affect. Normal behavior. Normal judgment and thought content.   Assessment and Plan:  Pregnancy: G1P0000 at [redacted]w[redacted]d 1. Supervision of high risk pregnancy, antepartum, third trimester (Primary) Up to date No concerns today Stopped taking metronidazole due to nausea and having her baby shower over the weekend.  No questions Placed orders in signed held for IOL  2. Diet controlled gestational diabetes mellitus (GDM) in third  trimester PP 113-116 and reports all under 95 fasting no log brought today. Reports elevated values after her baby shwoer.  2/6- Last EFW 62nd%, AC 80th%  IOL scheduled for 39-40wk (Estimated Date of Delivery: 05/25/23)-- 2/26 to 3/5 Given normal growth and reported normal BG in light of only recently completed CT treatment we had a shared decision making to plan for IOL on EDD.   3. Cholestasis of pregnancy in third trimester Clinically diagnosed by MFM. Bile acids and LFT have been normal multiple times. Sx is intermittent Does not meet criteria for ICP  4. Benign gestational thrombocytopenia in third trimester (HCC) Last PLT= 111 and have been stable from rechecks  5. Chlamydia@35  weeks Diagnosed 2/5 and prescription was sent. She just took the medication 2/12.  Partner also just started treatment with doxy Discussed important of avoid sex for 2 weeks   Preterm labor symptoms and general obstetric precautions including but not limited to vaginal bleeding, contractions, leaking of fluid and fetal movement were reviewed in detail with the patient. Please refer to After Visit Summary for other counseling recommendations.   Return in about 1 week (around 05/11/2023) for Routine prenatal care.  Future Appointments  Date Time Provider Department Center  05/12/2023  9:55 AM Venora Maples, MD [facility]  05/19/2023  9:55 AM Venora Maples, MD [facility]  05/26/2023  3:15 PM Venora Maples, MD [facility]  06/02/2023  2:55 PM [facility] US2 [facility]  06/02/2023  3:35 PM Crissie Reese Mary Sella, MD [facility]    Federico Flake, MD

## 2023-05-10 ENCOUNTER — Inpatient Hospital Stay (HOSPITAL_COMMUNITY): Payer: Medicaid Other | Admitting: Anesthesiology

## 2023-05-10 ENCOUNTER — Encounter (HOSPITAL_COMMUNITY): Payer: Self-pay | Admitting: Obstetrics & Gynecology

## 2023-05-10 ENCOUNTER — Inpatient Hospital Stay (HOSPITAL_COMMUNITY)
Admission: AD | Admit: 2023-05-10 | Discharge: 2023-05-12 | DRG: 806 | Disposition: A | Discharge status: Home / Self Care | Payer: Medicaid Other | Attending: Obstetrics and Gynecology | Admitting: Obstetrics and Gynecology

## 2023-05-10 ENCOUNTER — Other Ambulatory Visit: Payer: Self-pay

## 2023-05-10 DIAGNOSIS — E7879 Other disorders of bile acid and cholesterol metabolism: Secondary | ICD-10-CM | POA: Diagnosis present

## 2023-05-10 DIAGNOSIS — O9902 Anemia complicating childbirth: Secondary | ICD-10-CM | POA: Diagnosis present

## 2023-05-10 DIAGNOSIS — D573 Sickle-cell trait: Secondary | ICD-10-CM | POA: Diagnosis present

## 2023-05-10 DIAGNOSIS — O98813 Other maternal infectious and parasitic diseases complicating pregnancy, third trimester: Secondary | ICD-10-CM | POA: Diagnosis present

## 2023-05-10 DIAGNOSIS — K7689 Other specified diseases of liver: Secondary | ICD-10-CM | POA: Diagnosis present

## 2023-05-10 DIAGNOSIS — O24419 Gestational diabetes mellitus in pregnancy, unspecified control: Secondary | ICD-10-CM | POA: Diagnosis present

## 2023-05-10 DIAGNOSIS — O9912 Other diseases of the blood and blood-forming organs and certain disorders involving the immune mechanism complicating childbirth: Secondary | ICD-10-CM | POA: Diagnosis present

## 2023-05-10 DIAGNOSIS — O24420 Gestational diabetes mellitus in childbirth, diet controlled: Secondary | ICD-10-CM | POA: Diagnosis present

## 2023-05-10 DIAGNOSIS — Z148 Genetic carrier of other disease: Secondary | ICD-10-CM

## 2023-05-10 DIAGNOSIS — Z79899 Other long term (current) drug therapy: Secondary | ICD-10-CM | POA: Diagnosis not present

## 2023-05-10 DIAGNOSIS — O0993 Supervision of high risk pregnancy, unspecified, third trimester: Secondary | ICD-10-CM

## 2023-05-10 DIAGNOSIS — O1414 Severe pre-eclampsia complicating childbirth: Principal | ICD-10-CM | POA: Diagnosis present

## 2023-05-10 DIAGNOSIS — D696 Thrombocytopenia, unspecified: Secondary | ICD-10-CM | POA: Diagnosis present

## 2023-05-10 DIAGNOSIS — O4290 Premature rupture of membranes, unspecified as to length of time between rupture and onset of labor, unspecified weeks of gestation: Principal | ICD-10-CM | POA: Diagnosis present

## 2023-05-10 DIAGNOSIS — A749 Chlamydial infection, unspecified: Secondary | ICD-10-CM | POA: Diagnosis present

## 2023-05-10 DIAGNOSIS — Z3A37 37 weeks gestation of pregnancy: Secondary | ICD-10-CM

## 2023-05-10 DIAGNOSIS — E559 Vitamin D deficiency, unspecified: Secondary | ICD-10-CM | POA: Diagnosis present

## 2023-05-10 DIAGNOSIS — O2662 Liver and biliary tract disorders in childbirth: Secondary | ICD-10-CM | POA: Diagnosis not present

## 2023-05-10 DIAGNOSIS — O26643 Intrahepatic cholestasis of pregnancy, third trimester: Secondary | ICD-10-CM | POA: Diagnosis present

## 2023-05-10 DIAGNOSIS — D563 Thalassemia minor: Secondary | ICD-10-CM | POA: Diagnosis present

## 2023-05-10 DIAGNOSIS — O26893 Other specified pregnancy related conditions, third trimester: Secondary | ICD-10-CM | POA: Diagnosis present

## 2023-05-10 HISTORY — DX: Gestational diabetes mellitus in pregnancy, unspecified control: O24.419

## 2023-05-10 HISTORY — DX: Gestational (pregnancy-induced) hypertension without significant proteinuria, unspecified trimester: O13.9

## 2023-05-10 LAB — COMPREHENSIVE METABOLIC PANEL
ALT: 38 U/L (ref 0–44)
AST: 33 U/L (ref 15–41)
Albumin: 2.9 g/dL — ABNORMAL LOW (ref 3.5–5.0)
Alkaline Phosphatase: 201 U/L — ABNORMAL HIGH (ref 38–126)
Anion gap: 11 (ref 5–15)
BUN: 6 mg/dL (ref 6–20)
CO2: 18 mmol/L — ABNORMAL LOW (ref 22–32)
Calcium: 9.4 mg/dL (ref 8.9–10.3)
Chloride: 108 mmol/L (ref 98–111)
Creatinine, Ser: 0.83 mg/dL (ref 0.44–1.00)
GFR, Estimated: >60 mL/min (ref >60)
Glucose, Bld: 89 mg/dL (ref 70–99)
Potassium: 3.8 mmol/L (ref 3.5–5.1)
Sodium: 137 mmol/L (ref 135–145)
Total Bilirubin: 0.6 mg/dL (ref 0.0–1.2)
Total Protein: 6.2 g/dL — ABNORMAL LOW (ref 6.5–8.1)

## 2023-05-10 LAB — GLUCOSE, CAPILLARY
Glucose-Capillary: 84 mg/dL (ref 70–99)
Glucose-Capillary: 66 mg/dL — ABNORMAL LOW (ref 70–99)
Glucose-Capillary: 95 mg/dL (ref 70–99)
Glucose-Capillary: 83 mg/dL (ref 70–99)

## 2023-05-10 LAB — CBC WITH DIFFERENTIAL/PLATELET
Abs Immature Granulocytes: 0.08 10*3/uL — ABNORMAL HIGH (ref 0.00–0.07)
Basophils Absolute: 0 10*3/uL (ref 0.0–0.1)
Basophils Relative: 0 %
Eosinophils Absolute: 0 10*3/uL (ref 0.0–0.5)
Eosinophils Relative: 0 %
HCT: 34.2 % — ABNORMAL LOW (ref 36.0–46.0)
Hemoglobin: 11.4 g/dL — ABNORMAL LOW (ref 12.0–15.0)
Immature Granulocytes: 1 %
Lymphocytes Relative: 8 %
Lymphs Abs: 1.1 10*3/uL (ref 0.7–4.0)
MCH: 25.2 pg — ABNORMAL LOW (ref 26.0–34.0)
MCHC: 33.3 g/dL (ref 30.0–36.0)
MCV: 75.5 fL — ABNORMAL LOW (ref 80.0–100.0)
Monocytes Absolute: 1.1 10*3/uL — ABNORMAL HIGH (ref 0.1–1.0)
Monocytes Relative: 8 %
Neutro Abs: 12 10*3/uL — ABNORMAL HIGH (ref 1.7–7.7)
Neutrophils Relative %: 83 %
Platelets: 153 10*3/uL (ref 150–400)
RBC: 4.53 MIL/uL (ref 3.87–5.11)
RDW: 15.5 % (ref 11.5–15.5)
WBC: 14.3 10*3/uL — ABNORMAL HIGH (ref 4.0–10.5)
nRBC: 0 % (ref 0.0–0.2)

## 2023-05-10 LAB — PROTEIN / CREATININE RATIO, URINE
Creatinine, Urine: 54 mg/dL
Protein Creatinine Ratio: 1.35 mg/mg{creat} — ABNORMAL HIGH (ref 0.00–0.15)
Total Protein, Urine: 73 mg/dL

## 2023-05-10 LAB — CBC
HCT: 33 % — ABNORMAL LOW (ref 36.0–46.0)
Hemoglobin: 11 g/dL — ABNORMAL LOW (ref 12.0–15.0)
MCH: 25.2 pg — ABNORMAL LOW (ref 26.0–34.0)
MCHC: 33.3 g/dL (ref 30.0–36.0)
MCV: 75.7 fL — ABNORMAL LOW (ref 80.0–100.0)
Platelets: 147 10*3/uL — ABNORMAL LOW (ref 150–400)
RBC: 4.36 MIL/uL (ref 3.87–5.11)
RDW: 15.1 % (ref 11.5–15.5)
WBC: 7.2 10*3/uL (ref 4.0–10.5)
nRBC: 0 % (ref 0.0–0.2)

## 2023-05-10 LAB — RPR: RPR Ser Ql: NONREACTIVE

## 2023-05-10 LAB — POCT FERN TEST: POCT Fern Test: POSITIVE

## 2023-05-10 MED ORDER — BENZOCAINE-MENTHOL 20-0.5 % EX AERO
1.0000 | INHALATION_SPRAY | CUTANEOUS | Status: DC | PRN
  Administered 2023-05-11: 1 via TOPICAL
  Filled 2023-05-10: qty 56

## 2023-05-10 MED ORDER — LIDOCAINE HCL URETHRAL/MUCOSAL 2 % EX GEL
1.0000 | Freq: Once | CUTANEOUS | Status: AC
  Administered 2023-05-10: 1 via URETHRAL
  Filled 2023-05-10: qty 6

## 2023-05-10 MED ORDER — PRENATAL MULTIVITAMIN CH
1.0000 | ORAL_TABLET | Freq: Every day | ORAL | Status: DC
  Filled 2023-05-10 (×2): qty 1

## 2023-05-10 MED ORDER — EPHEDRINE 5 MG/ML INJ
10.0000 mg | INTRAVENOUS | Status: DC | PRN
  Filled 2023-05-10: qty 5

## 2023-05-10 MED ORDER — FENTANYL-BUPIVACAINE-NACL 0.5-0.125-0.9 MG/250ML-% EP SOLN
12.0000 mL/h | EPIDURAL | Status: DC | PRN
  Administered 2023-05-10: 12 mL/h via EPIDURAL
  Filled 2023-05-10: qty 250

## 2023-05-10 MED ORDER — ACETAMINOPHEN 325 MG PO TABS
650.0000 mg | ORAL_TABLET | ORAL | Status: DC | PRN
  Administered 2023-05-10 – 2023-05-12 (×4): 650 mg via ORAL
  Filled 2023-05-10 (×4): qty 2

## 2023-05-10 MED ORDER — SIMETHICONE 80 MG PO CHEW: 80.0000 mg | CHEWABLE_TABLET | ORAL | Status: DC | PRN

## 2023-05-10 MED ORDER — ACETAMINOPHEN 325 MG PO TABS: 650.0000 mg | ORAL_TABLET | ORAL | Status: DC | PRN

## 2023-05-10 MED ORDER — ONDANSETRON HCL 4 MG PO TABS: 4.0000 mg | ORAL_TABLET | ORAL | Status: DC | PRN

## 2023-05-10 MED ORDER — PHENYLEPHRINE 80 MCG/ML (10ML) SYRINGE FOR IV PUSH (FOR BLOOD PRESSURE SUPPORT)
80.0000 ug | PREFILLED_SYRINGE | INTRAVENOUS | Status: DC | PRN
  Filled 2023-05-10: qty 10

## 2023-05-10 MED ORDER — FENTANYL CITRATE (PF) 100 MCG/2ML IJ SOLN: 50.0000 ug | INTRAMUSCULAR | Status: DC | PRN

## 2023-05-10 MED ORDER — LABETALOL HCL 5 MG/ML IV SOLN
40.0000 mg | INTRAVENOUS | Status: DC | PRN
  Administered 2023-05-10: 40 mg via INTRAVENOUS
  Filled 2023-05-10: qty 8

## 2023-05-10 MED ORDER — HYDROXYZINE HCL 50 MG PO TABS: 50.0000 mg | ORAL_TABLET | Freq: Four times a day (QID) | ORAL | Status: DC | PRN

## 2023-05-10 MED ORDER — DIPHENHYDRAMINE HCL 50 MG/ML IJ SOLN: 12.5000 mg | INTRAMUSCULAR | Status: DC | PRN

## 2023-05-10 MED ORDER — LABETALOL HCL 5 MG/ML IV SOLN
20.0000 mg | INTRAVENOUS | Status: DC | PRN
  Administered 2023-05-10: 20 mg via INTRAVENOUS
  Filled 2023-05-10: qty 4

## 2023-05-10 MED ORDER — LIDOCAINE HCL (PF) 1 % IJ SOLN
INTRAMUSCULAR | Status: DC | PRN
  Administered 2023-05-10 (×2): 4 mL via EPIDURAL

## 2023-05-10 MED ORDER — LACTATED RINGERS IV SOLN: INTRAVENOUS | Status: DC

## 2023-05-10 MED ORDER — LABETALOL HCL 5 MG/ML IV SOLN: 80.0000 mg | INTRAVENOUS | Status: DC | PRN

## 2023-05-10 MED ORDER — COCONUT OIL OIL
1.0000 | TOPICAL_OIL | Status: DC | PRN
  Administered 2023-05-11: 1 via TOPICAL

## 2023-05-10 MED ORDER — DIPHENHYDRAMINE HCL 25 MG PO CAPS: 25.0000 mg | ORAL_CAPSULE | Freq: Four times a day (QID) | ORAL | Status: DC | PRN

## 2023-05-10 MED ORDER — LIDOCAINE HCL (PF) 1 % IJ SOLN: 30.0000 mL | INTRAMUSCULAR | Status: DC | PRN

## 2023-05-10 MED ORDER — MAGNESIUM SULFATE BOLUS VIA INFUSION
4.0000 g | Freq: Once | INTRAVENOUS | Status: AC
  Administered 2023-05-10: 4 g via INTRAVENOUS
  Filled 2023-05-10: qty 1000

## 2023-05-10 MED ORDER — ONDANSETRON HCL 4 MG/2ML IJ SOLN: 4.0000 mg | Freq: Four times a day (QID) | INTRAMUSCULAR | Status: DC | PRN

## 2023-05-10 MED ORDER — PHENYLEPHRINE 80 MCG/ML (10ML) SYRINGE FOR IV PUSH (FOR BLOOD PRESSURE SUPPORT): 80.0000 ug | PREFILLED_SYRINGE | INTRAVENOUS | Status: DC | PRN

## 2023-05-10 MED ORDER — FUROSEMIDE 20 MG PO TABS
20.0000 mg | ORAL_TABLET | Freq: Every day | ORAL | Status: DC
  Administered 2023-05-10: 20 mg via ORAL
  Filled 2023-05-10: qty 1

## 2023-05-10 MED ORDER — TERBUTALINE SULFATE 1 MG/ML IJ SOLN: 0.2500 mg | Freq: Once | INTRAMUSCULAR | Status: DC | PRN

## 2023-05-10 MED ORDER — EPHEDRINE 5 MG/ML INJ: 10.0000 mg | INTRAVENOUS | Status: DC | PRN

## 2023-05-10 MED ORDER — OXYTOCIN-SODIUM CHLORIDE 30-0.9 UT/500ML-% IV SOLN
1.0000 m[IU]/min | INTRAVENOUS | Status: DC
  Administered 2023-05-10: 2 m[IU]/min via INTRAVENOUS

## 2023-05-10 MED ORDER — LACTATED RINGERS IV SOLN: 500.0000 mL | INTRAVENOUS | Status: DC | PRN

## 2023-05-10 MED ORDER — WITCH HAZEL-GLYCERIN EX PADS
1.0000 | MEDICATED_PAD | CUTANEOUS | Status: DC | PRN
  Administered 2023-05-12: 1 via TOPICAL

## 2023-05-10 MED ORDER — SOD CITRATE-CITRIC ACID 500-334 MG/5ML PO SOLN: 30.0000 mL | ORAL | Status: DC | PRN

## 2023-05-10 MED ORDER — TETANUS-DIPHTH-ACELL PERTUSSIS 5-2.5-18.5 LF-MCG/0.5 IM SUSY: 0.5000 mL | PREFILLED_SYRINGE | Freq: Once | INTRAMUSCULAR | Status: DC

## 2023-05-10 MED ORDER — OXYCODONE-ACETAMINOPHEN 5-325 MG PO TABS: 2.0000 | ORAL_TABLET | ORAL | Status: DC | PRN

## 2023-05-10 MED ORDER — POTASSIUM CHLORIDE CRYS ER 20 MEQ PO TBCR
20.0000 meq | EXTENDED_RELEASE_TABLET | Freq: Every day | ORAL | Status: DC
  Administered 2023-05-10 – 2023-05-12 (×3): 20 meq via ORAL
  Filled 2023-05-10 (×3): qty 1

## 2023-05-10 MED ORDER — SENNOSIDES-DOCUSATE SODIUM 8.6-50 MG PO TABS
2.0000 | ORAL_TABLET | ORAL | Status: DC
  Administered 2023-05-12: 2 via ORAL
  Filled 2023-05-10 (×2): qty 2

## 2023-05-10 MED ORDER — ONDANSETRON HCL 4 MG/2ML IJ SOLN: 4.0000 mg | INTRAMUSCULAR | Status: DC | PRN

## 2023-05-10 MED ORDER — ZOLPIDEM TARTRATE 5 MG PO TABS: 5.0000 mg | ORAL_TABLET | Freq: Every evening | ORAL | Status: DC | PRN

## 2023-05-10 MED ORDER — LACTATED RINGERS IV SOLN: 500.0000 mL | Freq: Once | INTRAVENOUS | Status: DC

## 2023-05-10 MED ORDER — OXYCODONE-ACETAMINOPHEN 5-325 MG PO TABS: 1.0000 | ORAL_TABLET | ORAL | Status: DC | PRN

## 2023-05-10 MED ORDER — OXYTOCIN BOLUS FROM INFUSION: 333.0000 mL | Freq: Once | INTRAVENOUS | Status: DC

## 2023-05-10 MED ORDER — HYDRALAZINE HCL 20 MG/ML IJ SOLN: 10.0000 mg | INTRAMUSCULAR | Status: DC | PRN

## 2023-05-10 MED ORDER — OXYTOCIN-SODIUM CHLORIDE 30-0.9 UT/500ML-% IV SOLN
2.5000 [IU]/h | INTRAVENOUS | Status: DC
  Filled 2023-05-10: qty 500

## 2023-05-10 MED ORDER — NIFEDIPINE ER OSMOTIC RELEASE 30 MG PO TB24
30.0000 mg | ORAL_TABLET | Freq: Every day | ORAL | Status: DC
  Administered 2023-05-10 – 2023-05-11 (×2): 30 mg via ORAL
  Filled 2023-05-10 (×2): qty 1

## 2023-05-10 MED ORDER — IBUPROFEN 600 MG PO TABS
600.0000 mg | ORAL_TABLET | Freq: Four times a day (QID) | ORAL | Status: DC
  Administered 2023-05-10 – 2023-05-12 (×8): 600 mg via ORAL
  Filled 2023-05-10 (×8): qty 1

## 2023-05-10 MED ORDER — MAGNESIUM SULFATE 40 GM/1000ML IV SOLN
2.0000 g/h | INTRAVENOUS | Status: AC
  Administered 2023-05-10 (×2): 2 g/h via INTRAVENOUS
  Filled 2023-05-10 (×2): qty 1000

## 2023-05-10 MED ORDER — DIBUCAINE (PERIANAL) 1 % EX OINT: 1.0000 | TOPICAL_OINTMENT | CUTANEOUS | Status: DC | PRN

## 2023-05-10 NOTE — MAU Note (Signed)
Erin Mack is a 18 y.o. at [redacted]w[redacted]d here in MAU reporting: ROM around 2300 with clear fluid. Ctx started after ROM. Pt states ctx are every 2 minutes. +FM. Pt reports some spotting.   Onset of complaint: 2300 Pain score: 9/10 Vitals:   05/10/23 0026 05/10/23 0030  BP:  (!) 174/92  Pulse:  91  Resp: 20   Temp: 98.5 F (36.9 C)      FHT:152 Lab orders placed from triage:  mau labor

## 2023-05-10 NOTE — H&P (Addendum)
OBSTETRIC ADMISSION HISTORY AND PHYSICAL  Erin Mack is a 18 y.o. female G1P0000 with IUP at [redacted]w[redacted]d by [place] presenting for ROM at 2300. FOB and GMOB at bedside. She reports +FMs, + LOF, some spotting, no blurry vision, headaches or RUQ pain. She does report some leg swelling during this pregnancy. She plans on breast feeding. She is unsure about birth control plan at this time. She received her prenatal care at [facility]   Of note, patient reports recently completed courses of antibiotics for chlamydia and bacterial vaginosis.  Dating: By [place] --->  Estimated Date of Delivery: 05/25/23  Sono:   @[redacted]w[redacted]d , CWD, normal anatomy, cephalic presentation, posterior placenta, 2954g, 62% EFW   Prenatal History/Complications:  GDMA1 Cholestasis of pregnancy - diagnosed clinically without elevated bile acids or LFTs. Gestational thrombocytopenia - last PLT = 111 Chlamydia at 35 weeks - medication taken last week Bacterial vaginosis - metronidazole course completed Sickle cell trait Alpha thalassemia silent carrier  Past Medical History: Past Medical History:  Diagnosis Date   Asthma    Chlamydia    Gonorrhea    Sickle cell trait (HCC)     Past Surgical History: Past Surgical History:  Procedure Laterality Date   NO PAST SURGERIES      Obstetrical History: OB History     Gravida  1   Para  0   Term  0   Preterm  0   AB  0   Living  0      SAB  0   IAB  0   Ectopic  0   Multiple  0   Live Births  0           Social History Social History   Socioeconomic History   Marital status: Single    Spouse name: Not on file   Number of children: Not on file   Years of education: Not on file   Highest education level: Not on file  Occupational History   Not on file  Tobacco Use   Smoking status: Never    Passive exposure: Never   Smokeless tobacco: Never  Vaping Use   Vaping status: Former  Substance and Sexual Activity   Alcohol use: Not Currently    Comment:  occasionally   Drug use: Not Currently    Types: Marijuana    Comment: last used a few months prior to pregnancy   Sexual activity: Yes    Birth control/protection: None  Other Topics Concern   Not on file  Social History Narrative   Not on file   Social Drivers of Health   Financial Resource Strain: Not on file  Food Insecurity: Food Insecurity Present (03/02/2023)   Hunger Vital Sign    Worried About Running Out of Food in the Last Year: Sometimes true    Ran Out of Food in the Last Year: Never true  Transportation Needs: Not on file  Physical Activity: Not on file  Stress: Not on file  Social Connections: Not on file    Family History: Family History  Problem Relation Age of Onset   Cancer Paternal Grandfather    Lupus Paternal Grandmother    Cervical cancer Maternal Grandmother    Breast cancer Maternal Grandmother 71   Lupus Father    Sickle cell anemia Mother    Asthma Sister    Autism Sister     Allergies: Allergies  Allergen Reactions   Black Walnut Pollen Allergy Skin Test Shortness Of Breath, Itching and Other (  See Comments)    Mouth itches   Charentais Melon (French Melon) Shortness Of Breath, Itching, Nausea Only and Other (See Comments)    Mouth itches   Justicia Adhatoda (Malabar Nut Tree) [Justicia Adhatoda] Shortness Of Breath, Itching and Other (See Comments)    Mouth itches   Sesame Oil Shortness Of Breath, Itching and Other (See Comments)    Mouth itches   Watermelon [Citrullus Vulgaris] Shortness Of Breath, Itching and Other (See Comments)    Mouth itches   Aloe Rash and Other (See Comments)    Made the skin burn    Medications Prior to Admission  Medication Sig Dispense Refill Last Dose/Taking   Acetaminophen (TYLENOL PO) Take by mouth as needed.   05/09/2023   diphenhydrAMINE (BENADRYL) 25 mg capsule Take 1 capsule (25 mg total) by mouth every 6 (six) hours as needed (headache). 60 capsule 1 Past Week   Prenatal Vit-Fe Fumarate-FA  (MULTIVITAMIN-PRENATAL) 27-0.8 MG TABS tablet Take 1 tablet by mouth daily at 12 noon. 30 tablet 12 05/10/2023 Morning   Accu-Chek Softclix Lancets lancets Check glucose four times a day 100 each 12    Blood Glucose Monitoring Suppl (ACCU-CHEK GUIDE) w/Device KIT 1 kit by Does not apply route in the morning, at noon, in the evening, and at bedtime. 1 kit 0    glucose blood test strip Use as instructed; check blood glucose 4 times daily 100 each 3      Review of Systems   All systems reviewed and negative except as stated in HPI  Blood pressure (!) 156/99, pulse 87, temperature 98.5 F (36.9 C), temperature source Oral, resp. rate 20, last menstrual period 08/05/2022. General appearance: alert, cooperative, and moderate distress due to contractions Lungs: clear to auscultation bilaterally Heart: regular rate and rhythm Abdomen: soft, non-tender; bowel sounds normal Pelvic: deferred due to recent exam in MAU as below Extremities: Homans sign is negative, no sign of DVT, mild non-pitting edema DTR's normal Presentation: cephalic Fetal monitoringBaseline: 130 bpm, Variability: Good {> 6 bpm), Accelerations: Reactive, and Decelerations: Absent Uterine activity: uterine irritability on toco Dilation: 2 Effacement (%): 80 Station: 0 Exam by:: Wynelle Bourgeois, CNM   Prenatal labs: ABO, Rh: O/Positive/-- (08/21 1151) Antibody: Negative (08/21 1151) Rubella: 3.34 (08/21 1151) RPR: Non Reactive (12/03 0830)  HBsAg: Negative (08/21 1151)  HIV: Non Reactive (12/03 0830)  GBS: Negative/-- (02/04 0204)    Lab Results  Component Value Date   GBS Negative 04/26/2023   GTT - abnormal - GDMA1 Genetic screening  AFP negative. Carrier for sickle cell disease, silent carrier for alpha thalassemia. Anatomy US - normal  Immunization History  Administered Date(s) Administered   Tdap 02/22/2023    Prenatal Transfer Tool  Maternal Diabetes: Yes:  Diabetes Type:  Diet controlled Genetic  Screening: Normal Maternal Ultrasounds/Referrals: Normal Fetal Ultrasounds or other Referrals:  None Maternal Substance Abuse:  No Significant Maternal Medications:  None Significant Maternal Lab Results: Group B Strep negative Number of Prenatal Visits:greater than 3 verified prenatal visits Maternal Vaccinations:TDap Other Comments:  None   Results for orders placed or performed during the hospital encounter of 05/10/23 (from the past 24 hours)  POCT fern test   Collection Time: 05/10/23 12:26 AM  Result Value Ref Range   POCT Fern Test Positive = ruptured amniotic membanes     Patient Active Problem List   Diagnosis Date Noted   Chlamydia infection affecting pregnancy in third trimester, antepartum 05/04/2023   Alpha thalassemia silent carrier 03/07/2023  Cholestasis of pregnancy in third trimester 03/03/2023   Gestational thrombocytopenia (HCC) 02/23/2023   GDM (gestational diabetes mellitus) 02/23/2023   Sickle cell trait (HCC) 11/10/2022   Asthma 10/19/2022   Supervision of high risk pregnancy, antepartum, third trimester 10/19/2022   Vitamin D deficiency 05/08/2021    Assessment/Plan:  Gabrille Kilbride is a 18 y.o. G1P0000 at [redacted]w[redacted]d here for SROM.   #Labor: Patient in early latent labor. SROM around 2300 with CTX starting soon after. Proceed with expectant management. Plan for recheck 4-6 hours. #Pain: Epidural, PRN IV pain medication #FWB: Cat I  #GBS status:  negative #Feeding: Breastmilk  #Reproductive Life planning: Not Discussed - patient too uncomfortable to consider at this time #Circ:  not applicable  #GDMA1 - RBG 89 today #Hypertensive episode - labetalol PRN for SBP >160 or DBP >110, urine pr:cr pending, cr 0.83 (baseline ~0.59) #Anemia, mild - Hg 11.0 today, improved from few weeks ago #Gestational thrombocytopenia - PLT today 147, improved from few weeks ago   Katie L. De Burrs, MD Family Medicine Resident, PGY-1 05/10/2023, 12:46 AM  GME ATTESTATION:   Evaluation and management procedures were performed by the [facility] Medicine Resident under my supervision. I was immediately available for direct supervision, assistance and direction throughout this encounter.  I also confirm that I have verified the information documented in the resident's note, and that I have also personally reperformed the pertinent components of the physical exam and all of the medical decision making activities.  I have also made any necessary editorial changes.  Wyn Forster, MD OB Fellow, Faculty Practice [facility], Center for [facility] 05/10/2023 2:36 AM

## 2023-05-10 NOTE — Progress Notes (Signed)
Labor Progress Note Erin Mack is a 18 y.o. G1P0000 at [redacted]w[redacted]d presented for SROM at 2300. S: Sitting up comfortably with epidural in place  O: Pressures starting to improve.  BP (!) 151/81   Pulse (!) 133   Temp 99.2 F (37.3 C) (Axillary)   Resp 17   LMP 08/05/2022 (Within Weeks)   SpO2 100%  EFM: baseline 150/+accel/-decel  CVE: Dilation: 6.5 Effacement (%): 90 Station: -2 Presentation: Vertex Exam by:: H.Price, RN   A&P: 18 y.o. G1P0000 [redacted]w[redacted]d here for SROM. #Labor: Slow progression, contractions of spaced out.  Will start Pitocin 2 x 2. #Pain: epidural #FWB: Cat I #GBS negative #Preeclampsia with severe features (BP) - multiple severe range blood pressures requiring labetalol x3. Given continued severe range pressures, magnesium sulfate initiated. Creatinine 0.83 (baseline ~0.59). Urine pr:cr pending. Long discussion with patient and support persons at bedside - all questioned answered.  Derrel Nip, MD Attending Family Medicine Physician, [facility] for [facility], [facility]   05/10/2023 10:22 AM

## 2023-05-10 NOTE — Progress Notes (Signed)
Hypoglycemic Event  CBG: 66  Treatment: 8 oz juice/soda  Symptoms: Shaky  Follow-up CBG: Time:0558 CBG Result:95  Possible Reasons for Event: Inadequate meal intake and Other: labor  Comments/MD notified:  MD notified of cbg of 66, 8 oz of apple juice was given, MD ordered a retake in 15 mins after 8 oz oral juice was given for hypoglycemia. Follow up cbg was normal.     Loetta Rough, RN

## 2023-05-10 NOTE — Anesthesia Preprocedure Evaluation (Signed)
Anesthesia Evaluation  Patient identified by MRN, date of birth, ID band Patient awake    Reviewed: Allergy & Precautions, NPO status , Patient's Chart, lab work & pertinent test results  History of Anesthesia Complications Negative for: history of anesthetic complications  Airway Mallampati: III  TM Distance: >3 FB Neck ROM: Full    Dental   Pulmonary asthma    Pulmonary exam normal breath sounds clear to auscultation       Cardiovascular negative cardio ROS  Rhythm:Regular Rate:Normal     Neuro/Psych negative neurological ROS     GI/Hepatic Neg liver ROS,,,cholestasis   Endo/Other  diabetes, Gestational    Renal/GU negative Renal ROS     Musculoskeletal   Abdominal   Peds  Hematology  (+) Blood dyscrasia (gestational thrombocytopenia, alpha thalassemia silent carrier), Sickle cell trait Lab Results      Component                Value               Date                      WBC                      7.2                 05/10/2023                HGB                      11.0 (L)            05/10/2023                HCT                      33.0 (L)            05/10/2023                MCV                      75.7 (L)            05/10/2023                PLT                      147 (L)             05/10/2023              Anesthesia Other Findings   Reproductive/Obstetrics (+) Pregnancy                             Anesthesia Physical Anesthesia Plan  ASA: 2  Anesthesia Plan: Epidural   Post-op Pain Management:    Induction:   PONV Risk Score and Plan:   Airway Management Planned: Natural Airway  Additional Equipment:   Intra-op Plan:   Post-operative Plan:   Informed Consent: I have reviewed the patients History and Physical, chart, labs and discussed the procedure including the risks, benefits and alternatives for the proposed anesthesia with the patient or authorized  representative who has indicated his/her understanding and acceptance.       Plan Discussed with: Anesthesiologist  Anesthesia Plan Comments: (I have discussed  risks of neuraxial anesthesia including but not limited to infection, bleeding, nerve injury, back pain, headache, seizures, and failure of block. Patient denies bleeding disorders and is not currently anticoagulated. Labs have been reviewed. Risks and benefits discussed. All patient's questions answered.  )       Anesthesia Quick Evaluation

## 2023-05-10 NOTE — Anesthesia Procedure Notes (Signed)
Epidural Patient location during procedure: OB Start time: 05/10/2023 1:23 AM End time: 05/10/2023 1:29 AM  Staffing Anesthesiologist: Linton Rump, MD Performed: anesthesiologist   Preanesthetic Checklist Completed: patient identified, IV checked, site marked, risks and benefits discussed, surgical consent, monitors and equipment checked, pre-op evaluation and timeout performed  Epidural Patient position: sitting Prep: DuraPrep and site prepped and draped Patient monitoring: continuous pulse ox and blood pressure Approach: midline Location: L3-L4 Injection technique: LOR saline  Needle:  Needle type: Tuohy  Needle gauge: 17 G Needle length: 9 cm and 9 Needle insertion depth: 7 cm Catheter type: closed end flexible Catheter size: 19 Gauge Catheter at skin depth: 12 cm Test dose: negative  Assessment Events: blood not aspirated, no cerebrospinal fluid, injection not painful, no injection resistance, no paresthesia and negative IV test  Additional Notes The patient has requested an epidural for labor pain management. Risks and benefits including, but not limited to, infection, bleeding, local anesthetic toxicity, headache, hypotension, back pain, block failure, etc. were discussed with the patient. The patient expressed understanding and consented to the procedure. I confirmed that the patient has no bleeding disorders and is not taking blood thinners. I confirmed the patient's last platelet count with the nurse. A time-out was performed immediately prior to the procedure. Please see nursing documentation for vital signs. Sterile technique was used throughout the whole procedure. Once LOR achieved, the epidural catheter threaded easily without resistance. Aspiration of the catheter was negative for blood and CSF. The epidural was dosed slowly and an infusion was started.  2 attempt(s) - LOR on first attempt but catheter, though threading, wasn't threading easily. Catheter  and Tuohy needle removed as a unit with tip intact. Second approach successful.Reason for block:procedure for pain

## 2023-05-10 NOTE — Progress Notes (Addendum)
Labor Progress Note Erin Mack is a 18 y.o. G1P0000 at [redacted]w[redacted]d presented for SROM at 2300. S: Patient uncomfortable, foley being placed. FOB and GMOB at bedside. Pain improved after epidural.  O: Pressures starting to improve.  BP (!) 136/104   Pulse 93   Temp 98.7 F (37.1 C) (Axillary)   Resp 18   LMP 08/05/2022 (Within Weeks)   SpO2 100%  EFM: baseline 150/+accel/-decel  CVE: Dilation: 4 Effacement (%): 80 Station: 0 Presentation: Vertex Exam by:: Ned Grace, RN   A&P: 18 y.o. G1P0000 [redacted]w[redacted]d here for SROM. #Labor: Progressing well after SROM around 2300. Proceed with expectant management.  #Pain: epidural #FWB: Cat I #GBS negative #Preeclampsia with severe features (BP) - multiple severe range blood pressures requiring labetalol x3. Given continued severe range pressures, magnesium sulfate initiated. Creatinine 0.83 (baseline ~0.59). Urine pr:cr pending. Long discussion with patient and support persons at bedside - all questioned answered.  Katie L. De Burrs, MD Family Medicine Resident, PGY-1 3:05 AM  GME ATTESTATION:  Evaluation and management procedures were performed by the [facility] Medicine Resident under my supervision. I was immediately available for direct supervision, assistance and direction throughout this encounter.  I also confirm that I have verified the information documented in the resident's note, and that I have also personally reperformed the pertinent components of the physical exam and all of the medical decision making activities.  I have also made any necessary editorial changes.  Wyn Forster, MD OB Fellow, Faculty Practice [facility], Center for [facility] 05/10/2023 4:13 AM

## 2023-05-10 NOTE — Lactation Note (Signed)
This note was copied from a baby's chart. Lactation Consultation Note  Patient Name: Erin Mack WUJWJ'X Date: 05/10/2023 Age:20 hours Reason for consult: Initial assessment;Primapara;1st time breastfeeding;Early term 37-38.6wks;Maternal endocrine disorder  P1- MOB reports that feedings have been going well, but she feels uneasy about positioning infant to latch. Infant had just eaten an hour prior to consult and was fast asleep, so no latch was seen at this consult. LC reviewed how to latch/position infant. Place infant belly to belly, start infant's nose at level with MOB's nipple and wait until infant's mouth is wide enough to latch onto the breast. LC encouraged MOB to call out for a feeding. Mob was gifted a hands free pump and had not received one through her insurance. LC encouraged MOB to reach out to her insurance for a DEBP. MOB does not qualify for a Stork pump.  LC reviewed the first 24 hr birthday nap, feeding infant on cue 8-12x in 24 hrs, not allowing infant to go over 3 hrs without a feeding, CDC milk storage guidelines and LC services handout. LC encouraged MOB to call for further assistance as needed.  Maternal Data Has patient been taught Hand Expression?: No Does the patient have breastfeeding experience prior to this delivery?: No  Feeding Mother's Current Feeding Choice: Breast Milk  Lactation Tools Discussed/Used Pump Education: Milk Storage  Interventions Interventions: Breast feeding basics reviewed;Education;LC Services brochure  Discharge Discharge Education: Warning signs for feeding baby Pump: Hands Free;Personal;Advised to call insurance company  Consult Status Consult Status: Follow-up Date: 05/11/23 Follow-up type: In-patient    Dema Severin BS, IBCLC 05/10/2023, 7:48 PM

## 2023-05-10 NOTE — Discharge Summary (Signed)
Postpartum Discharge Summary  Date of Service updated***     Patient Name: Erin Mack DOB: 11-20-2005 MRN: [id number]  Date of admission: 05/10/2023 Delivery date:05/10/2023 Delivering provider: Celedonio Savage Date of discharge: 05/10/2023  Admitting diagnosis: Leakage of amniotic fluid [O42.90] Intrauterine pregnancy: [redacted]w[redacted]d     Secondary diagnosis:  Principal Problem:   Leakage of amniotic fluid Active Problems:   Supervision of high risk pregnancy, antepartum, third trimester   Vitamin D deficiency   Sickle cell trait (HCC)   Gestational thrombocytopenia (HCC)   GDM (gestational diabetes mellitus)   Alpha thalassemia silent carrier   Chlamydia infection affecting pregnancy in third trimester, antepartum  Additional problems: ***    Discharge diagnosis: Term Pregnancy Delivered, Preeclampsia (severe), and GDM A1                                              Post partum procedures:{Postpartum procedures:23558} Augmentation: Pitocin Complications: None  Hospital course: Induction of Labor With Vaginal Delivery   18 y.o. yo G1P0000 at [redacted]w[redacted]d was admitted to the hospital 05/10/2023 for induction of labor.  Indication for induction: Preeclampsia and A1 DM.  Patient had an labor course complicated by severe preeclampsia requiring magnesium Membrane Rupture Time/Date: 11:00 PM,05/09/2023  Delivery Method:Vaginal, Spontaneous Operative Delivery:N/A Episiotomy: None Lacerations:  1st degree;Vaginal Details of delivery can be found in separate delivery note.  Patient had a postpartum course complicated by***. Patient is discharged home 05/10/23.  Newborn Data: Birth date:05/10/2023 Birth time:2:49 PM Gender:Female Living status:Living Apgars:9 ,9  Weight:   Magnesium Sulfate received: Yes: Seizure prophylaxis BMZ received: No Rhophylac:N/A MMR:{MMR:30440033} T-DaP:{Tdap:23962} Flu: {UUV:25366} RSV Vaccine received: {RSV:31013} Transfusion:{Transfusion  received:30440034}  Immunizations received: Immunization History  Administered Date(s) Administered   Tdap 02/22/2023    Physical exam  Vitals:   05/10/23 1431 05/10/23 1454 05/10/23 1501 05/10/23 1516  BP: 137/80 115/73 (!) 120/59 135/83  Pulse: (!) 135 (!) 147 (!) 139 (!) 138  Resp:      Temp:      TempSrc:      SpO2:       General: {Exam; general:21111117} Lochia: {Desc; appropriate/inappropriate:30686::"appropriate"} Uterine Fundus: {Desc; firm/soft:30687} Incision: {Exam; incision:21111123} DVT Evaluation: {Exam; dvt:2111122} Labs: Lab Results  Component Value Date   WBC 7.2 05/10/2023   HGB 11.0 (L) 05/10/2023   HCT 33.0 (L) 05/10/2023   MCV 75.7 (L) 05/10/2023   PLT 147 (L) 05/10/2023      Latest Ref Rng & Units 05/10/2023   12:35 AM  CMP  Glucose 70 - 99 mg/dL 89   BUN 6 - 20 mg/dL 6   Creatinine 4.40 - 3.47 mg/dL 4.25   Sodium 956 - 387 mmol/L 137   Potassium 3.5 - 5.1 mmol/L 3.8   Chloride 98 - 111 mmol/L 108   CO2 22 - 32 mmol/L 18   Calcium 8.9 - 10.3 mg/dL 9.4   Total Protein 6.5 - 8.1 g/dL 6.2   Total Bilirubin 0.0 - 1.2 mg/dL 0.6   Alkaline Phos 38 - 126 U/L 201   AST 15 - 41 U/L 33   ALT 0 - 44 U/L 38    Edinburgh Score:     No data to display         No data recorded  After visit meds:  Allergies as of 05/10/2023       Reactions  Black Walnut Pollen Allergy Skin Test Shortness Of Breath, Itching, Other (See Comments)   Mouth itches   Charentais Melon (french Melon) Shortness Of Breath, Itching, Nausea Only, Other (See Comments)   Mouth itches   Justicia Adhatoda (malabar Nut Tree) [justicia Adhatoda] Shortness Of Breath, Itching, Other (See Comments)   Mouth itches   Sesame Oil Shortness Of Breath, Itching, Other (See Comments)   Mouth itches   Watermelon [citrullus Vulgaris] Shortness Of Breath, Itching, Other (See Comments)   Mouth itches   Aloe Rash, Other (See Comments)   Made the skin burn     Med Rec must be completed  prior to using this SMARTLINK***        Discharge home in stable condition Infant Feeding: {Baby feeding:23562} Infant Disposition:{CHL IP OB HOME WITH WUJWJX:91478} Discharge instruction: per After Visit Summary and Postpartum booklet. Activity: Advance as tolerated. Pelvic rest for 6 weeks.  Diet: {OB GNFA:21308657} Future Appointments: Future Appointments  Date Time Provider Department Center  05/12/2023  9:55 AM Venora Maples, MD [facility]  05/19/2023  9:55 AM Venora Maples, MD [facility]  05/26/2023  3:15 PM Venora Maples, MD [facility]  06/02/2023  2:55 PM [facility]  06/02/2023  3:35 PM Crissie Reese, Mary Sella, MD [facility]   Follow up Visit:  Message sent Please schedule this patient for a In person postpartum visit in 4 weeks with the following provider: MD Cresenzo. Additional Postpartum F/U:2 hour GTT and BP check 1 week  High risk pregnancy complicated by: GDM and HTN Delivery mode:  Vaginal, Spontaneous Anticipated Birth Control:  Unsure   05/10/2023 Celedonio Savage, MD

## 2023-05-11 ENCOUNTER — Encounter (HOSPITAL_COMMUNITY): Payer: Self-pay | Admitting: Obstetrics & Gynecology

## 2023-05-11 DIAGNOSIS — O1414 Severe pre-eclampsia complicating childbirth: Principal | ICD-10-CM

## 2023-05-11 HISTORY — DX: Severe pre-eclampsia complicating childbirth: O14.14

## 2023-05-11 MED ORDER — NIFEDIPINE ER OSMOTIC RELEASE 30 MG PO TB24
30.0000 mg | ORAL_TABLET | Freq: Every day | ORAL | Status: DC
  Administered 2023-05-12: 30 mg via ORAL
  Filled 2023-05-11: qty 1

## 2023-05-11 MED ORDER — FUROSEMIDE 20 MG PO TABS
20.0000 mg | ORAL_TABLET | Freq: Two times a day (BID) | ORAL | Status: DC
  Administered 2023-05-11 – 2023-05-12 (×3): 20 mg via ORAL
  Filled 2023-05-11 (×3): qty 1

## 2023-05-11 MED ORDER — NIFEDIPINE ER OSMOTIC RELEASE 30 MG PO TB24
30.0000 mg | ORAL_TABLET | Freq: Two times a day (BID) | ORAL | Status: DC
Start: 2023-05-11 — End: 2023-05-11

## 2023-05-11 NOTE — Progress Notes (Signed)
Post Partum Day 1 s/p SVD at [redacted]w[redacted]d, Severe preeclampsia, A1GDM Subjective: No complaints, up ad lib, voiding, tolerating PO, and + flatus Patient denies any headaches, visual symptoms, RUQ/epigastric pain or other concerning symptoms. Moderate lochia.  Breastfeeding.  Baby is stable at bedside, FOB in room.  Objective: Blood pressure (!) 144/86, pulse 99, temperature 98.6 F (37 C), temperature source Oral, resp. rate 17, last menstrual period 08/05/2022, SpO2 100%, unknown if currently breastfeeding.  Physical Exam:  General: alert and no distress Lochia: appropriate Uterine Fundus: firm, NT DVT Evaluation: No evidence of DVT seen on physical exam.  Negative Homan's sign. 2+BLE edema  Recent Labs    05/10/23 0035 05/10/23 1645  HGB 11.0* 11.4*  HCT 33.0* 34.2*    Assessment/Plan: Magnesium sulfate will be discontinued around 1500. Continue Procardia XL 30 mg po daily, Lasix 20 mg po bid.  Titrate regimen as needed for BP control Follow up fasting CBG later today Breastfeeding, will get Lactation consult Contraception unsure for now Routine postpartum care    LOS: 1 day   Jaynie Collins, MD 05/11/2023, 1:21 AM

## 2023-05-11 NOTE — Anesthesia Postprocedure Evaluation (Signed)
Anesthesia Post Note  Patient: Erin Mack  Procedure(s) Performed: AN AD HOC LABOR EPIDURAL     Patient location during evaluation: Mother Baby Anesthesia Type: Epidural Level of consciousness: awake, awake and alert and oriented Pain management: satisfactory to patient Vital Signs Assessment: post-procedure vital signs reviewed and stable Respiratory status: nonlabored ventilation, respiratory function stable and spontaneous breathing Cardiovascular status: blood pressure returned to baseline and stable Postop Assessment: no headache, no backache, no apparent nausea or vomiting, able to ambulate, adequate PO intake and patient able to bend at knees Anesthetic complications: no   No notable events documented.  Last Vitals:  Vitals:   05/11/23 0600 05/11/23 0745  BP:  (!) 146/89  Pulse:  100  Resp: 18 18  Temp:  36.6 C  SpO2:  100%    Last Pain:  Vitals:   05/11/23 0745  TempSrc: Oral  PainSc:    Pain Goal:                   Tam Savoia

## 2023-05-12 ENCOUNTER — Other Ambulatory Visit (HOSPITAL_COMMUNITY): Payer: Self-pay

## 2023-05-12 ENCOUNTER — Encounter: Payer: Medicaid Other | Admitting: Family Medicine

## 2023-05-12 MED ORDER — NIFEDIPINE ER 30 MG PO TB24
30.0000 mg | ORAL_TABLET | Freq: Every day | ORAL | 1 refills | Status: DC
  Filled 2023-05-12: qty 30, 30d supply, fill #0

## 2023-05-12 MED ORDER — IBUPROFEN 600 MG PO TABS
600.0000 mg | ORAL_TABLET | Freq: Four times a day (QID) | ORAL | 0 refills | Status: DC
  Filled 2023-05-12: qty 30, 8d supply, fill #0

## 2023-05-12 MED ORDER — FUROSEMIDE 20 MG PO TABS: 20.0000 mg | ORAL_TABLET | Freq: Two times a day (BID) | ORAL | 0 refills | Status: DC

## 2023-05-12 MED ORDER — NIFEDIPINE ER 30 MG PO TB24: 30.0000 mg | ORAL_TABLET | Freq: Every day | ORAL | 1 refills | Status: DC

## 2023-05-12 MED ORDER — IBUPROFEN 600 MG PO TABS: 600.0000 mg | ORAL_TABLET | Freq: Four times a day (QID) | ORAL | 0 refills | Status: DC

## 2023-05-12 MED ORDER — FUROSEMIDE 20 MG PO TABS
20.0000 mg | ORAL_TABLET | Freq: Two times a day (BID) | ORAL | 0 refills | Status: DC
  Filled 2023-05-12: qty 5, 3d supply, fill #0

## 2023-05-12 NOTE — Lactation Note (Signed)
This note was copied from a baby's chart. Lactation Consultation Note  Patient Name: Erin Mack WUJWJ'X Date: 05/12/2023 Age:20 hours Reason for consult: Follow-up assessment;1st time breastfeeding;Early term 37-38.6wks;Maternal endocrine disorder  P1, Baby [redacted]w[redacted]d.  7.5% weight loss.  3 voids/3 stools in the last 24 hours. Baby sleeping in mother's lap after recent 20 min breastfeeding session. Discussed post pumping after feeding if baby is sleepy or hand expressing and giving drops on the spoon. Reviewed engorgement care and monitoring voids/stools. Suggest calling for help as needed today.   Maternal Data Does the patient have breastfeeding experience prior to this delivery?: No  Feeding Mother's Current Feeding Choice: Breast Milk Lactation Tools Discussed/Used  Breast pump  Interventions Interventions: Education  Discharge Discharge Education: Engorgement and breast care;Warning signs for feeding baby Pump: Personal;Hands Free  Consult Status Consult Status: Complete Date: 05/12/23    Dahlia Byes [facility] 05/12/2023, 8:44 AM

## 2023-05-14 LAB — BPAM RBC
Blood Product Expiration Date: 202502242359
Blood Product Expiration Date: 202502242359
Unit Type and Rh: 5100
Unit Type and Rh: 5100

## 2023-05-14 LAB — TYPE AND SCREEN
ABO/RH(D): O POS
Antibody Screen: POSITIVE
Donor AG Type: NEGATIVE
Donor AG Type: NEGATIVE
Unit division: 0
Unit division: 0

## 2023-05-15 ENCOUNTER — Other Ambulatory Visit: Payer: Self-pay | Admitting: Family Medicine

## 2023-05-16 ENCOUNTER — Other Ambulatory Visit: Payer: Self-pay

## 2023-05-19 ENCOUNTER — Encounter: Payer: Medicaid Other | Admitting: Family Medicine

## 2023-05-20 ENCOUNTER — Telehealth (HOSPITAL_COMMUNITY): Payer: Self-pay | Admitting: *Deleted

## 2023-05-20 NOTE — Telephone Encounter (Signed)
 05/20/2023  Name: Neilani Duffee MRN: [id number] DOB: 2005-05-29  Reason for Call:  Transition of Care Hospital Discharge Call  Contact Status: Patient Contact Status: Message  Language assistant needed:          Follow-Up Questions:    Inocente Salles Postnatal Depression Scale:  In the Past 7 Days:    PHQ2-9 Depression Scale:     Discharge Follow-up:    Post-discharge interventions: NA  Salena Saner, RN 05/20/2023 12:44

## 2023-05-25 ENCOUNTER — Inpatient Hospital Stay (HOSPITAL_COMMUNITY): Payer: Medicaid Other

## 2023-05-25 ENCOUNTER — Inpatient Hospital Stay (HOSPITAL_COMMUNITY): Admission: RE | Admit: 2023-05-25 | Payer: Medicaid Other | Source: Home / Self Care | Admitting: Family Medicine

## 2023-05-26 ENCOUNTER — Encounter: Payer: Medicaid Other | Admitting: Family Medicine

## 2023-06-02 ENCOUNTER — Encounter: Payer: Medicaid Other | Admitting: Family Medicine

## 2023-06-02 ENCOUNTER — Other Ambulatory Visit: Payer: Medicaid Other

## 2023-06-16 ENCOUNTER — Ambulatory Visit: Payer: Medicaid Other | Admitting: Obstetrics & Gynecology

## 2023-06-22 ENCOUNTER — Ambulatory Visit (INDEPENDENT_AMBULATORY_CARE_PROVIDER_SITE_OTHER): Payer: Medicaid Other | Admitting: Family Medicine

## 2023-06-22 NOTE — Progress Notes (Unsigned)
    Post Partum Visit Note  Erin Mack is a 18 y.o. G25P1001 female who presents for a postpartum visit. She is 6 weeks postpartum following a normal spontaneous vaginal delivery.  I have fully reviewed the prenatal and intrapartum course. The delivery was at 37 gestational weeks.  Anesthesia: epidural. Postpartum course has been uncomplicated. Baby is doing well]. Baby is feeding by breast. Bleeding staining only. Bowel function is normal. Bladder function is normal. Patient is not sexually active. Contraception method is none. Postpartum depression screening: negative.   The pregnancy intention screening data noted above was reviewed. Potential methods of contraception were discussed. The patient elected to proceed with No data recorded.    Health Maintenance Due  Topic Date Due   COVID-19 Vaccine (1) Never done   Pneumococcal Vaccine 69-43 Years old (1 of 2 - PCV) Never done   HPV VACCINES (1 - 3-dose series) Never done    The following portions of the patient's history were reviewed and updated as appropriate: allergies, current medications, past family history, past medical history, past social history, past surgical history, and problem list.  Review of Systems Pertinent items are noted in HPI.  Objective:  LMP 08/05/2022 (Within Weeks)    General:  alert, cooperative, and appears stated age   Breasts:  not indicated  Lungs: Normal work of breathing, speaking full sentences  Heart:  Regular rate  Abdomen: soft, non-tender; bowel sounds normal; no masses,  no organomegaly   GU exam:  normal       Assessment:    There are no diagnoses linked to this encounter.  Normal postpartum exam.   Plan:   Essential components of care per ACOG recommendations:  1.  Mood and well being: Patient with negative depression screening today. Reviewed local resources for support.  - Patient tobacco use? No.   - hx of drug use? No.    2. Infant care and feeding:  -Patient  currently breastmilk feeding? {yes/no:25502}  -Social determinants of health (SDOH) reviewed in EPIC. No concerns***The following needs were identified***  3. Sexuality, contraception and birth spacing - Patient {DOES_DOES ZOX:09604} want a pregnancy in the next year.  Desired family size is {NUMBER 1-10:22536} children.  - Reviewed reproductive life planning. Reviewed contraceptive methods based on pt preferences and effectiveness.  Patient desired {Upstream End Methods:24109} today.   - Discussed birth spacing of 18 months  4. Sleep and fatigue -Encouraged family/partner/community support of 4 hrs of uninterrupted sleep to help with mood and fatigue  5. Physical Recovery  - Discussed patients delivery and complications. She describes her labor as {description:25511} - Patient had a {CHL AMB DELIVERY:[phone]}. Patient had a {laceration:25518} laceration. Perineal healing reviewed. Patient expressed understanding - Patient has urinary incontinence? {yes/no:25515} - Patient {ACTION; IS/IS VWU:[id number]} safe to resume physical and sexual activity  6.  Health Maintenance - HM due items addressed {Yes or If no, why not?:20788} - Last pap smear No results found for: "DIAGPAP" Pap smear {done:10129} at today's visit.  -Breast Cancer screening indicated? {indicated:25516}  7. Chronic Disease/Pregnancy Condition follow up: {Follow up:25499}  - PCP follow up  Coolidge Breeze, RMA Center for [facility], [facility]

## 2023-08-18 ENCOUNTER — Encounter (HOSPITAL_BASED_OUTPATIENT_CLINIC_OR_DEPARTMENT_OTHER): Payer: Self-pay

## 2023-08-18 ENCOUNTER — Other Ambulatory Visit: Payer: Self-pay

## 2023-08-18 ENCOUNTER — Emergency Department (HOSPITAL_BASED_OUTPATIENT_CLINIC_OR_DEPARTMENT_OTHER)
Admission: EM | Admit: 2023-08-18 | Discharge: 2023-08-18 | Discharge status: Against Medical Advice | Attending: Emergency Medicine | Admitting: Emergency Medicine

## 2023-08-18 DIAGNOSIS — R1030 Lower abdominal pain, unspecified: Secondary | ICD-10-CM | POA: Diagnosis present

## 2023-08-18 DIAGNOSIS — R112 Nausea with vomiting, unspecified: Secondary | ICD-10-CM | POA: Insufficient documentation

## 2023-08-18 DIAGNOSIS — Z5321 Procedure and treatment not carried out due to patient leaving prior to being seen by health care provider: Secondary | ICD-10-CM | POA: Diagnosis not present

## 2023-08-18 LAB — URINALYSIS, ROUTINE W REFLEX MICROSCOPIC
Bilirubin Urine: NEGATIVE
Glucose, UA: NEGATIVE mg/dL
Hgb urine dipstick: NEGATIVE
Ketones, ur: NEGATIVE mg/dL
Leukocytes,Ua: NEGATIVE
Nitrite: NEGATIVE
Protein, ur: NEGATIVE mg/dL
Specific Gravity, Urine: 1.016 (ref 1.005–1.030)
pH: 6 (ref 5.0–8.0)

## 2023-08-18 LAB — COMPREHENSIVE METABOLIC PANEL WITH GFR
ALT: 18 U/L (ref 0–44)
AST: 17 U/L (ref 15–41)
Albumin: 4.2 g/dL (ref 3.5–5.0)
Alkaline Phosphatase: 124 U/L (ref 38–126)
Anion gap: 11 (ref 5–15)
BUN: 9 mg/dL (ref 6–20)
CO2: 24 mmol/L (ref 22–32)
Calcium: 9.4 mg/dL (ref 8.9–10.3)
Chloride: 104 mmol/L (ref 98–111)
Creatinine, Ser: 0.75 mg/dL (ref 0.44–1.00)
GFR, Estimated: >60 mL/min (ref >60)
Glucose, Bld: 105 mg/dL — ABNORMAL HIGH (ref 70–99)
Potassium: 4.5 mmol/L (ref 3.5–5.1)
Sodium: 140 mmol/L (ref 135–145)
Total Bilirubin: 0.2 mg/dL (ref 0.0–1.2)
Total Protein: 7 g/dL (ref 6.5–8.1)

## 2023-08-18 LAB — CBC
HCT: 38.8 % (ref 36.0–46.0)
Hemoglobin: 12.7 g/dL (ref 12.0–15.0)
MCH: 25.4 pg — ABNORMAL LOW (ref 26.0–34.0)
MCHC: 32.7 g/dL (ref 30.0–36.0)
MCV: 77.6 fL — ABNORMAL LOW (ref 80.0–100.0)
Platelets: 191 10*3/uL (ref 150–400)
RBC: 5 MIL/uL (ref 3.87–5.11)
RDW: 16.8 % — ABNORMAL HIGH (ref 11.5–15.5)
WBC: 7.7 10*3/uL (ref 4.0–10.5)
nRBC: 0.3 % — ABNORMAL HIGH (ref 0.0–0.2)

## 2023-08-18 LAB — PREGNANCY, URINE: Preg Test, Ur: NEGATIVE

## 2023-08-18 LAB — LIPASE, BLOOD: Lipase: 26 U/L (ref 11–51)

## 2023-08-18 NOTE — ED Triage Notes (Signed)
 Pt reports recently being dx with UTI and completing round of abx. Pt reports lower abd pain radiating to back and also endorses N/V x2 weeks.

## 2023-08-18 NOTE — ED Notes (Signed)
 Called pt  2xs no answer

## 2023-08-19 ENCOUNTER — Emergency Department (HOSPITAL_COMMUNITY)

## 2023-08-19 ENCOUNTER — Encounter (HOSPITAL_COMMUNITY): Payer: Self-pay

## 2023-08-19 ENCOUNTER — Other Ambulatory Visit: Payer: Self-pay

## 2023-08-19 ENCOUNTER — Emergency Department (HOSPITAL_COMMUNITY)
Admission: EM | Admit: 2023-08-19 | Discharge: 2023-08-19 | Disposition: A | Discharge status: Home / Self Care | Attending: Emergency Medicine | Admitting: Emergency Medicine

## 2023-08-19 DIAGNOSIS — R109 Unspecified abdominal pain: Secondary | ICD-10-CM

## 2023-08-19 DIAGNOSIS — R1030 Lower abdominal pain, unspecified: Secondary | ICD-10-CM | POA: Insufficient documentation

## 2023-08-19 DIAGNOSIS — M545 Low back pain, unspecified: Secondary | ICD-10-CM | POA: Insufficient documentation

## 2023-08-19 LAB — COMPREHENSIVE METABOLIC PANEL WITH GFR
ALT: 17 U/L (ref 0–44)
AST: 15 U/L (ref 15–41)
Albumin: 3.9 g/dL (ref 3.5–5.0)
Alkaline Phosphatase: 114 U/L (ref 38–126)
Anion gap: 7 (ref 5–15)
BUN: 9 mg/dL (ref 6–20)
CO2: 25 mmol/L (ref 22–32)
Calcium: 9.4 mg/dL (ref 8.9–10.3)
Chloride: 106 mmol/L (ref 98–111)
Creatinine, Ser: 0.8 mg/dL (ref 0.44–1.00)
GFR, Estimated: >60 mL/min (ref >60)
Glucose, Bld: 82 mg/dL (ref 70–99)
Potassium: 4.3 mmol/L (ref 3.5–5.1)
Sodium: 138 mmol/L (ref 135–145)
Total Bilirubin: 0.4 mg/dL (ref 0.0–1.2)
Total Protein: 7.4 g/dL (ref 6.5–8.1)

## 2023-08-19 LAB — URINALYSIS, ROUTINE W REFLEX MICROSCOPIC
Bilirubin Urine: NEGATIVE
Glucose, UA: NEGATIVE mg/dL
Hgb urine dipstick: NEGATIVE
Ketones, ur: NEGATIVE mg/dL
Leukocytes,Ua: NEGATIVE
Nitrite: NEGATIVE
Protein, ur: NEGATIVE mg/dL
Specific Gravity, Urine: 1.012 (ref 1.005–1.030)
pH: 6 (ref 5.0–8.0)

## 2023-08-19 LAB — CBC
HCT: 40.2 % (ref 36.0–46.0)
Hemoglobin: 13.1 g/dL (ref 12.0–15.0)
MCH: 25.6 pg — ABNORMAL LOW (ref 26.0–34.0)
MCHC: 32.6 g/dL (ref 30.0–36.0)
MCV: 78.5 fL — ABNORMAL LOW (ref 80.0–100.0)
Platelets: 181 10*3/uL (ref 150–400)
RBC: 5.12 MIL/uL — ABNORMAL HIGH (ref 3.87–5.11)
RDW: 16.7 % — ABNORMAL HIGH (ref 11.5–15.5)
WBC: 6.2 10*3/uL (ref 4.0–10.5)
nRBC: 0 % (ref 0.0–0.2)

## 2023-08-19 LAB — LIPASE, BLOOD: Lipase: 34 U/L (ref 11–51)

## 2023-08-19 LAB — HCG, SERUM, QUALITATIVE: Preg, Serum: NEGATIVE

## 2023-08-19 MED ORDER — IOHEXOL 300 MG/ML  SOLN
100.0000 mL | Freq: Once | INTRAMUSCULAR | Status: AC | PRN
  Administered 2023-08-19: 100 mL via INTRAVENOUS

## 2023-08-19 MED ORDER — NAPROXEN 375 MG PO TABS: 375.0000 mg | ORAL_TABLET | Freq: Two times a day (BID) | ORAL | 0 refills | Status: AC

## 2023-08-19 NOTE — ED Notes (Signed)
 Patient transported to CT

## 2023-08-19 NOTE — ED Provider Triage Note (Signed)
 Emergency Medicine Provider Triage Evaluation Note  Erin Mack , a 18 y.o. female  was evaluated in triage.  Pt complains of abdominal pain.  Right lower quadrant pain for 2 weeks.  At times it can be in the right flank as well.  Denies urinary symptoms but does report nausea vomiting diarrhea.  Review of Systems  Positive: See above Negative: See above  Physical Exam  BP 125/89   Pulse 87   Temp 98.6 F (37 C) (Oral)   Resp 16   Ht 5\' 5"  (1.651 m)   Wt 83 kg   SpO2 100%   BMI 30.45 kg/m  Gen:   Awake, no distress   Resp:  Normal effort  MSK:   Moves extremities without difficulty  Other:  Rlq tenderness  Medical Decision Making  Medically screening exam initiated at 5:55 PM.  Appropriate orders placed.  Georgenia Salim was informed that the remainder of the evaluation will be completed by another provider, this initial triage assessment does not replace that evaluation, and the importance of remaining in the ED until their evaluation is complete.  Work up started   [employer], PA-C 08/19/23 1756

## 2023-08-19 NOTE — ED Provider Notes (Signed)
 [place] EMERGENCY DEPARTMENT AT [facility] Provider Note   CSN: [id number] Arrival date & time: 08/19/23  1704     History  Chief Complaint  Patient presents with   Flank Pain    Erin Mack is a 18 y.o. female.   Flank Pain     Patient has a history of sickle cell trait, prior STI, gestational diabetes.  Patient states she started having symptoms of pain in her lower abdomen for the last couple weeks.  Patient states she went to an urgent care and was diagnosed with urinary tract infection.  Patient states she has been having pain diffusely in her abdomen.  She has more pain in her lower back.  Tends to get worse after meals.  Patient denies any vaginal discharge or any vaginal bleeding.  She is having pain in her lower back.  Patient states her symptoms persisted so she came to the ED today  Home Medications Prior to Admission medications   Medication Sig Start Date End Date Taking? Authorizing Provider  naproxen  (NAPROSYN ) 375 MG tablet Take 1 tablet (375 mg total) by mouth 2 (two) times daily. 08/19/23  Yes Trish Furl, MD  Prenatal Vit-Fe Fumarate-FA (MULTIVITAMIN-PRENATAL) 27-0.8 MG TABS tablet Take 1 tablet by mouth daily at 12 noon. 11/10/22   Abner Ables, MD      Allergies    Black walnut pollen allergy skin test, Charentais melon (french melon), Justicia adhatoda (malabar nut tree) [justicia adhatoda], Sesame oil, Watermelon [citrullus vulgaris], and Aloe    Review of Systems   Review of Systems  Genitourinary:  Positive for flank pain.    Physical Exam Updated Vital Signs BP 125/89   Pulse 87   Temp 98.6 F (37 C) (Oral)   Resp 16   Ht 1.651 m (5\' 5" )   Wt 83 kg   SpO2 100%   BMI 30.45 kg/m  Physical Exam Vitals and nursing note reviewed.  Constitutional:      General: She is not in acute distress.    Appearance: She is well-developed.  HENT:     Head: Normocephalic and atraumatic.     Right Ear: External ear normal.      Left Ear: External ear normal.  Eyes:     General: No scleral icterus.       Right eye: No discharge.        Left eye: No discharge.     Conjunctiva/sclera: Conjunctivae normal.  Neck:     Trachea: No tracheal deviation.  Cardiovascular:     Rate and Rhythm: Normal rate and regular rhythm.  Pulmonary:     Effort: Pulmonary effort is normal. No respiratory distress.     Breath sounds: Normal breath sounds. No stridor. No wheezing or rales.  Abdominal:     General: Bowel sounds are normal. There is no distension.     Palpations: Abdomen is soft.     Tenderness: There is no abdominal tenderness. There is no guarding or rebound.  Musculoskeletal:        General: No tenderness or deformity.     Cervical back: Neck supple.  Skin:    General: Skin is warm and dry.     Findings: No rash.  Neurological:     General: No focal deficit present.     Mental Status: She is alert.     Cranial Nerves: No cranial nerve deficit, dysarthria or facial asymmetry.     Sensory: No sensory deficit.  Motor: No abnormal muscle tone or seizure activity.     Coordination: Coordination normal.  Psychiatric:        Mood and Affect: Mood normal.     ED Results / Procedures / Treatments   Labs (all labs ordered are listed, but only abnormal results are displayed) Labs Reviewed  CBC - Abnormal; Notable for the following components:      Result Value   RBC 5.12 (*)    MCV 78.5 (*)    MCH 25.6 (*)    RDW 16.7 (*)    All other components within normal limits  LIPASE, BLOOD  COMPREHENSIVE METABOLIC PANEL WITH GFR  URINALYSIS, ROUTINE W REFLEX MICROSCOPIC  HCG, SERUM, QUALITATIVE    EKG None  Radiology CT ABDOMEN PELVIS W CONTRAST Result Date: 08/19/2023 CLINICAL DATA:  Right lower quadrant pain EXAM: CT ABDOMEN AND PELVIS WITH CONTRAST TECHNIQUE: Multidetector CT imaging of the abdomen and pelvis was performed using the standard protocol following bolus administration of intravenous  contrast. RADIATION DOSE REDUCTION: This exam was performed according to the departmental dose-optimization program which includes automated exposure control, adjustment of the mA and/or kV according to patient size and/or use of iterative reconstruction technique. CONTRAST:  100mL OMNIPAQUE IOHEXOL 300 MG/ML  SOLN COMPARISON:  None Available. FINDINGS: Lower chest: No acute abnormality. Hepatobiliary: No focal liver abnormality is seen. No gallstones, gallbladder wall thickening, or biliary dilatation. Pancreas: Unremarkable. No pancreatic ductal dilatation or surrounding inflammatory changes. Spleen: Normal in size without focal abnormality. Adrenals/Urinary Tract: Adrenal glands are unremarkable. Kidneys are normal, without renal calculi, focal lesion, or hydronephrosis. Bladder is unremarkable. Stomach/Bowel: Stomach is within normal limits. Appendix appears normal. No evidence of bowel wall thickening, distention, or inflammatory changes. Vascular/Lymphatic: No significant vascular findings are present. No enlarged abdominal or pelvic lymph nodes. Reproductive: Uterus and bilateral adnexa are unremarkable. Other: No abdominal wall hernia or abnormality. Trace free fluid in the pelvis. Musculoskeletal: No acute or significant osseous findings. IMPRESSION: Negative CT appearance of the abdomen and pelvis. Normal appendix. Electronically Signed   By: Esmeralda Hedge M.D.   On: 08/19/2023 20:35    Procedures Procedures    Medications Ordered in ED Medications  iohexol (OMNIPAQUE) 300 MG/ML solution 100 mL (100 mLs Intravenous Contrast Given 08/19/23 1906)    ED Course/ Medical Decision Making/ A&P Clinical Course as of 08/19/23 2056  Fri Aug 19, 2023  2017 CBC normal.  Urinalysis normal.  Metabolic panel normal.  Pregnancy test negative.  Lipase normal. [JK]  2041 CT scan does not show any acute abnormality. [JK]    Clinical Course User Index [JK] Trish Furl, MD                                  Medical Decision Making Problems Addressed: Abdominal pain, unspecified abdominal location: acute illness or injury that poses a threat to life or bodily functions  Amount and/or Complexity of Data Reviewed Labs: ordered. Decision-making details documented in ED Course. Radiology: ordered and independent interpretation performed.   Patient presented to the ED with complaints of abdominal discomfort.  Patient states she has been having symptoms for couple weeks.  Patient does not have any focal tenderness on exam.  Her ED workup is reassuring.  She has no signs of leukocytosis.  No pancreatitis hepatitis.  Patient is not pregnant.  No signs of UTI.  Patient had a CT abdomen pelvis ordered at triage which does  not show any acute abnormality  Evaluation and diagnostic testing in the emergency department does not suggest an emergent condition requiring admission or immediate intervention beyond what has been performed at this time.  The patient is safe for discharge and has been instructed to return immediately for worsening symptoms, change in symptoms or any other concerns.         Final Clinical Impression(s) / ED Diagnoses Final diagnoses:  Abdominal pain, unspecified abdominal location    Rx / DC Orders ED Discharge Orders          Ordered    naproxen  (NAPROSYN ) 375 MG tablet  2 times daily        08/19/23 2056              Trish Furl, MD 08/19/23 2056

## 2023-08-19 NOTE — ED Triage Notes (Signed)
 Bilateral flank pain that radiates into lower abdomen since last week, pt was seen at [facility] 2 weeks ago and diagnosed with UTI, placed on abx, took all of abx. Pt denies urinary sx.

## 2023-08-19 NOTE — Discharge Instructions (Addendum)
 Take medications as needed for pain and discomfort.  Follow-up with your primary care doctor to be rechecked

## 2023-09-16 ENCOUNTER — Encounter: Payer: Self-pay | Admitting: Family Medicine

## 2023-09-22 ENCOUNTER — Encounter

## 2023-10-24 NOTE — BH Specialist Note (Unsigned)
Pt did not arrive to video visit and did not answer the phone ; Left HIPPA-compliant message to call back Halden Phegley from [facility] at  [phone] (Samauri Kellenberger's office).    

## 2023-10-25 ENCOUNTER — Ambulatory Visit: Admitting: Clinical

## 2023-10-25 DIAGNOSIS — Z91199 Patient's noncompliance with other medical treatment and regimen due to unspecified reason: Secondary | ICD-10-CM

## 2023-11-12 ENCOUNTER — Emergency Department (HOSPITAL_COMMUNITY)
Admission: EM | Admit: 2023-11-12 | Discharge: 2023-11-13 | Disposition: A | Discharge status: Home / Self Care | Attending: Emergency Medicine | Admitting: Emergency Medicine

## 2023-11-12 ENCOUNTER — Other Ambulatory Visit: Payer: Self-pay

## 2023-11-12 ENCOUNTER — Encounter (HOSPITAL_COMMUNITY): Payer: Self-pay | Admitting: Emergency Medicine

## 2023-11-12 DIAGNOSIS — K0889 Other specified disorders of teeth and supporting structures: Secondary | ICD-10-CM | POA: Insufficient documentation

## 2023-11-12 DIAGNOSIS — G8918 Other acute postprocedural pain: Secondary | ICD-10-CM | POA: Insufficient documentation

## 2023-11-12 MED ORDER — OXYCODONE-ACETAMINOPHEN 5-325 MG PO TABS
1.0000 | ORAL_TABLET | ORAL | Status: DC | PRN
  Administered 2023-11-12: 1 via ORAL
  Filled 2023-11-12: qty 1

## 2023-11-12 NOTE — ED Triage Notes (Signed)
 Pt reports wisdom teeth removal approx 2 weeks ago. Reports she has a dry socket & pain is unbearable. Also wants to be checked for UTI. Reports burning w/ urination.

## 2023-11-12 NOTE — ED Provider Notes (Signed)
  [place] EMERGENCY DE[facility]MENT AT [facility] Provider Note   CSN: [id number] Arrival date & time: 11/12/23  2244     Patient presents with: Dental Pain   Erin Mack is a 18 y.o. female presents today for dental pain.  Patient states she had her wisdom teeth removed approximately 2 weeks ago and has dry socket.  Patient reports pain is unbearable.  Patient also concerned for UTI as she has dysuria.  Patient denies fever, chill, nausea, vomiting, discharge, swelling, any other complaints at this time.  {Add pertinent medical, surgical, social history, OB history to YEP:67052}  Dental Pain      Prior to Admission medications   Medication Sig Start Date End Date Taking? Authorizing Provider  naproxen  (NAPROSYN ) 375 MG tablet Take 1 tablet (375 mg total) by mouth 2 (two) times daily. 08/19/23   Randol Simmonds, MD  Prenatal Vit-Fe Fumarate-FA (MULTIVITAMIN-PRENATAL) 27-0.8 MG TABS tablet Take 1 tablet by mouth daily at 12 noon. 11/10/22   Eldonna Suzen Octave, MD    Allergies: Black walnut pollen allergy skin test, Charentais melon (french melon), Justicia adhatoda (malabar nut tree) [justicia adhatoda], Sesame oil, Watermelon [citrullus vulgaris], and Aloe    Review of Systems  HENT:  Positive for dental problem.   Genitourinary:  Positive for dysuria.    Updated Vital Signs BP 132/86 (BP Location: Left Arm)   Pulse 94   Temp 98.1 F (36.7 C) (Oral)   Resp 18   Ht 5' 5 (1.651 m)   Wt 86.2 kg   SpO2 100%   Breastfeeding Yes   BMI 31.62 kg/m   Physical Exam  (all labs ordered are listed, but only abnormal results are displayed) Labs Reviewed  URINALYSIS, ROUTINE W REFLEX MICROSCOPIC    EKG: None  Radiology: No results found.  {Document cardiac monitor, telemetry assessment procedure when appropriate:32947} Procedures   Medications Ordered in the ED  oxyCODONE -acetaminophen  (PERCOCET/ROXICET) 5-325 MG per tablet 1 tablet (1 tablet Oral Given  11/12/23 2305)      {Click here for ABCD2, HEART and other calculators REFRESH Note before signing:1}                              Medical Decision Making Amount and/or Complexity of Data Reviewed Labs: ordered.  Risk Prescription drug management.   ***  {Document critical care time when appropriate  Document review of labs and clinical decision tools ie CHADS2VASC2, etc  Document your independent review of radiology images and any outside records  Document your discussion with family members, caretakers and with consultants  Document social determinants of health affecting pt's care  Document your decision making why or why not admission, treatments were needed:32947:::1}   Final diagnoses:  None    ED Discharge Orders     None

## 2023-11-13 LAB — URINALYSIS, ROUTINE W REFLEX MICROSCOPIC
Bilirubin Urine: NEGATIVE
Glucose, UA: NEGATIVE mg/dL
Hgb urine dipstick: NEGATIVE
Ketones, ur: NEGATIVE mg/dL
Nitrite: NEGATIVE
Protein, ur: NEGATIVE mg/dL
Specific Gravity, Urine: 1.013 (ref 1.005–1.030)
pH: 7 (ref 5.0–8.0)

## 2023-11-13 MED ORDER — OXYCODONE-ACETAMINOPHEN 5-325 MG PO TABS: 1.0000 | ORAL_TABLET | Freq: Four times a day (QID) | ORAL | 0 refills | Status: AC | PRN

## 2023-11-13 NOTE — Discharge Instructions (Addendum)
 Today you were seen for post wisdom tooth removal pain.  Please pick up your medication and take as prescribed.  You may also alternate taking Tylenol  and Motrin  as needed for mild to moderate dental pain.  Please follow-up with your oral surgeon for further evaluation workup.  You have a urine culture pending and will be notified if these results require treatment.  Thank you for letting us  treat you today. After reviewing your labs and imaging, I feel you are safe to go home. Please follow up with your PCP in the next several days and provide them with your records from this visit. Return to the Emergency Room if pain becomes severe or symptoms worsen.

## 2023-11-14 LAB — URINE CULTURE: Culture: <10000 — AB

## 2023-12-08 ENCOUNTER — Ambulatory Visit: Admitting: Family Medicine

## 2023-12-21 ENCOUNTER — Other Ambulatory Visit: Payer: Self-pay

## 2023-12-21 ENCOUNTER — Ambulatory Visit: Admitting: Internal Medicine

## 2023-12-21 ENCOUNTER — Encounter (HOSPITAL_COMMUNITY): Payer: Self-pay | Admitting: Emergency Medicine

## 2023-12-21 ENCOUNTER — Emergency Department (HOSPITAL_COMMUNITY)
Admission: EM | Admit: 2023-12-21 | Discharge: 2023-12-22 | Disposition: A | Discharge status: Home / Self Care | Attending: Emergency Medicine | Admitting: Emergency Medicine

## 2023-12-21 DIAGNOSIS — R202 Paresthesia of skin: Secondary | ICD-10-CM | POA: Insufficient documentation

## 2023-12-21 DIAGNOSIS — M791 Myalgia, unspecified site: Secondary | ICD-10-CM | POA: Diagnosis present

## 2023-12-21 DIAGNOSIS — M25552 Pain in left hip: Secondary | ICD-10-CM | POA: Insufficient documentation

## 2023-12-21 DIAGNOSIS — R52 Pain, unspecified: Secondary | ICD-10-CM

## 2023-12-21 LAB — URINALYSIS, ROUTINE W REFLEX MICROSCOPIC
Bilirubin Urine: NEGATIVE
Glucose, UA: NEGATIVE mg/dL
Hgb urine dipstick: NEGATIVE
Ketones, ur: NEGATIVE mg/dL
Leukocytes,Ua: NEGATIVE
Nitrite: NEGATIVE
Protein, ur: NEGATIVE mg/dL
Specific Gravity, Urine: 1.014 (ref 1.005–1.030)
pH: 6 (ref 5.0–8.0)

## 2023-12-21 LAB — COMPREHENSIVE METABOLIC PANEL WITH GFR
ALT: 12 U/L (ref 0–44)
AST: 15 U/L (ref 15–41)
Albumin: 4.8 g/dL (ref 3.5–5.0)
Alkaline Phosphatase: 142 U/L — ABNORMAL HIGH (ref 38–126)
Anion gap: 12 (ref 5–15)
BUN: 18 mg/dL (ref 6–20)
CO2: 23 mmol/L (ref 22–32)
Calcium: 9.8 mg/dL (ref 8.9–10.3)
Chloride: 106 mmol/L (ref 98–111)
Creatinine, Ser: 0.83 mg/dL (ref 0.44–1.00)
GFR, Estimated: >60 mL/min (ref >60)
Glucose, Bld: 77 mg/dL (ref 70–99)
Potassium: 4.8 mmol/L (ref 3.5–5.1)
Sodium: 140 mmol/L (ref 135–145)
Total Bilirubin: 0.3 mg/dL (ref 0.0–1.2)
Total Protein: 7.8 g/dL (ref 6.5–8.1)

## 2023-12-21 LAB — CBC WITH DIFFERENTIAL/PLATELET
Abs Immature Granulocytes: 0.02 K/uL (ref 0.00–0.07)
Basophils Absolute: 0.1 K/uL (ref 0.0–0.1)
Basophils Relative: 1 %
Eosinophils Absolute: 0.7 K/uL — ABNORMAL HIGH (ref 0.0–0.5)
Eosinophils Relative: 8 %
HCT: 44.8 % (ref 36.0–46.0)
Hemoglobin: 14.6 g/dL (ref 12.0–15.0)
Immature Granulocytes: 0 %
Lymphocytes Relative: 33 %
Lymphs Abs: 2.8 K/uL (ref 0.7–4.0)
MCH: 26.3 pg (ref 26.0–34.0)
MCHC: 32.6 g/dL (ref 30.0–36.0)
MCV: 80.7 fL (ref 80.0–100.0)
Monocytes Absolute: 0.6 K/uL (ref 0.1–1.0)
Monocytes Relative: 7 %
Neutro Abs: 4.4 K/uL (ref 1.7–7.7)
Neutrophils Relative %: 51 %
Platelets: 220 K/uL (ref 150–400)
RBC: 5.55 MIL/uL — ABNORMAL HIGH (ref 3.87–5.11)
RDW: 13.8 % (ref 11.5–15.5)
WBC: 8.4 K/uL (ref 4.0–10.5)
nRBC: 0 % (ref 0.0–0.2)

## 2023-12-21 LAB — PREGNANCY, URINE: Preg Test, Ur: NEGATIVE

## 2023-12-21 LAB — MAGNESIUM: Magnesium: 2.4 mg/dL (ref 1.7–2.4)

## 2023-12-21 NOTE — ED Triage Notes (Addendum)
 Patient presents due to sharp throbbing generalized body pain. Pain started on Monday. She also endorses fatigue. Patient denies shortness of breath, dizziness, nausea, vomiting and diarrhea. She reports no change in appetite.

## 2023-12-21 NOTE — ED Provider Notes (Signed)
  [place] EMERGENCY DEPARTMENT AT [facility] Provider Note   CSN: [id number] Arrival date & time: 12/21/23  2138     Patient presents with: Generalized Body Aches   Flannery Cavallero is a 18 y.o. female.  {Add pertinent medical, surgical, social history, OB history to YEP:67052} The history is provided by the patient.   Myra Aveah Castell is a 18 y.o. female who presents to the Emergency Department complaining of *** Body aches, shooting pain throbbing, pins and needles Left hip 20 min Left arm.  Constant Sxs started on Monday afternoon. Cold makes it worse, heat helps. Movement makes it worse.   Since 13, saw rheum. Worse with stress  Last episode in August for a week.  No fever. Sometimes gets hot flashes.   Sickle cell trait. 7 months ago delivered.  Mom sickle cell, dad lupus. DM  Prior to Admission medications   Medication Sig Start Date End Date Taking? Authorizing Provider  naproxen  (NAPROSYN ) 375 MG tablet Take 1 tablet (375 mg total) by mouth 2 (two) times daily. 08/19/23   Randol Simmonds, MD  Prenatal Vit-Fe Fumarate-FA (MULTIVITAMIN-PRENATAL) 27-0.8 MG TABS tablet Take 1 tablet by mouth daily at 12 noon. 11/10/22   Eldonna Suzen Octave, MD    Allergies: Black walnut pollen allergy skin test, Charentais melon (french melon), Justicia adhatoda (malabar nut tree) [justicia adhatoda], Sesame oil, Watermelon [citrullus vulgaris], and Aloe    Review of Systems  Updated Vital Signs BP 117/73 (BP Location: Right Arm)   Pulse 90   Temp 98.4 F (36.9 C) (Oral)   Resp 17   SpO2 100%   Physical Exam  (all labs ordered are listed, but only abnormal results are displayed) Labs Reviewed  CBC WITH DIFFERENTIAL/PLATELET - Abnormal; Notable for the following components:      Result Value   RBC 5.55 (*)    Eosinophils Absolute 0.7 (*)    All other components within normal limits  COMPREHENSIVE METABOLIC PANEL WITH GFR - Abnormal; Notable for the following  components:   Alkaline Phosphatase 142 (*)    All other components within normal limits  URINALYSIS, ROUTINE W REFLEX MICROSCOPIC - Abnormal; Notable for the following components:   Color, Urine STRAW (*)    All other components within normal limits  MAGNESIUM     EKG: None  Radiology: No results found.  {Document cardiac monitor, telemetry assessment procedure when appropriate:32947} Procedures   Medications Ordered in the ED - No data to display    {Click here for ABCD2, HEART and other calculators REFRESH Note before signing:1}                              Medical Decision Making  ***  {Document critical care time when appropriate  Document review of labs and clinical decision tools ie CHADS2VASC2, etc  Document your independent review of radiology images and any outside records  Document your discussion with family members, caretakers and with consultants  Document social determinants of health affecting pt's care  Document your decision making why or why not admission, treatments were needed:32947:::1}   Final diagnoses:  None    ED Discharge Orders     None

## 2023-12-21 NOTE — ED Notes (Signed)
 Pt ambulatory back to room with triage RN.

## 2023-12-21 NOTE — ED Provider Triage Note (Addendum)
 Emergency Medicine Provider Triage Evaluation Note  Erin Mack , a 18 y.o. female  was evaluated in triage.  Pt complains of pain all over and fatigue been occurring since 18yo. Was evaluated by rheumatology on 04/28/21 who r/o SLE, CRPS. Did have low TSH but normal T4. Had low Vitamin D  but is currently taking vitamin supplements.  Took ibuprofen  and tylenol  wo relief  Review of Systems  Positive: See hpi Negative: Cough, congestion, urinary symptoms  Physical Exam  BP 117/73 (BP Location: Right Arm)   Pulse 90   Temp 98.4 F (36.9 C) (Oral)   Resp 17   SpO2 100%  Gen:   Awake, no distress   Resp:  Normal effort  MSK:   Moves extremities without difficulty  Other:    Medical Decision Making  Medically screening exam initiated at 10:00 PM.  Appropriate orders placed.  Erin Mack was informed that the remainder of the evaluation will be completed by another provider, this initial triage assessment does not replace that evaluation, and the importance of remaining in the ED until their evaluation is complete.  Basic labs ordered to r/o electrolyte abnormality, infection   Erin Tinnie BRAVO, PA 12/21/23 2206    Erin Tinnie BRAVO, PA 12/21/23 2207

## 2023-12-22 MED ORDER — KETOROLAC TROMETHAMINE 60 MG/2ML IM SOLN
60.0000 mg | Freq: Once | INTRAMUSCULAR | Status: AC
  Administered 2023-12-22: 60 mg via INTRAMUSCULAR
  Filled 2023-12-22: qty 2

## 2023-12-22 NOTE — ED Notes (Signed)
 AVS provided by edp was reviewed with the pt. Pt verbalized understanding with no additional questions at this time.

## 2023-12-26 ENCOUNTER — Ambulatory Visit: Admitting: Family Medicine

## 2024-01-12 ENCOUNTER — Ambulatory Visit: Admitting: Internal Medicine

## 2024-01-30 ENCOUNTER — Ambulatory Visit: Admitting: Family Medicine

## 2024-02-07 ENCOUNTER — Ambulatory Visit: Payer: Self-pay | Admitting: Family Medicine

## 2024-02-07 ENCOUNTER — Encounter: Payer: Self-pay | Admitting: Family Medicine

## 2024-02-07 VITALS — BP 124/80 | HR 74 | Wt 208.4 lb

## 2024-02-07 DIAGNOSIS — R718 Other abnormality of red blood cells: Secondary | ICD-10-CM

## 2024-02-07 DIAGNOSIS — M545 Low back pain, unspecified: Secondary | ICD-10-CM | POA: Diagnosis not present

## 2024-02-07 DIAGNOSIS — R52 Pain, unspecified: Secondary | ICD-10-CM

## 2024-02-07 NOTE — Patient Instructions (Signed)
 Cymbalta

## 2024-02-07 NOTE — Progress Notes (Signed)
 Name: Erin Mack   Date of Visit: 02/07/24   Date of last visit with me: 12/26/2023   CHIEF COMPLAINT:  Chief Complaint  Patient presents with   Establish Care    New patient, has pain that keeps her from walking correctly, happens randomly. Wants to talk about hormonal changes sense giving birth.        HPI:  Discussed the use of AI scribe software for clinical note transcription with the patient, who gave verbal consent to proceed.  History of Present Illness   Erin Mack is a 18 year old female who presents with chronic pain and postpartum hormonal changes.  She experiences chronic, sharp, and throbbing pain that occurs randomly in various parts of her body, including her legs, arms, and torso. The pain has been persistent for a long time, even before her recent childbirth, and can be triggered by stress or physical exertion. She has previously consulted specialists, including rheumatologists and endocrinologists, but no definitive diagnosis was made. She has a history of sickle cell trait, inherited from her mother, who has sickle cell disease. Despite extensive workups, including hematology evaluations, no specific cause for her pain has been identified. She manages her pain with ibuprofen  800 mg and acetaminophen  1000 mg, but finds it insufficient.  She experiences numbness in her legs, which began postpartum. The numbness occurs without any apparent cause, such as sitting in a particular position, and lasts for a minute or two. This symptom developed after receiving an epidural during childbirth. No burning sensation is associated with her pain.  She describes hormonal changes postpartum, including hot flashes and irritability. She is currently breastfeeding, which may be affecting her hormonal balance and menstrual cycle. She has had one menstrual cycle since giving birth, which occurred a few weeks ago.  She has a history of being very active, including  dancing, but her current pain levels have significantly reduced her ability to engage in physical activities.         OBJECTIVE:       02/07/2024    3:35 PM  Depression screen PHQ 2/9  Decreased Interest 0  Down, Depressed, Hopeless 0  PHQ - 2 Score 0     BP Readings from Last 3 Encounters:  02/07/24 124/80  12/22/23 (!) 113/100  11/13/23 (!) 135/91    BP 124/80   Pulse 74   Wt 208 lb 6.4 oz (94.5 kg)   SpO2 98%   BMI 34.68 kg/m    Physical Exam          Physical Exam Constitutional:      Appearance: Normal appearance.  Neurological:     General: No focal deficit present.     Mental Status: She is alert and oriented to person, place, and time. Mental status is at baseline.     ASSESSMENT/PLAN:   Assessment & Plan Acute bilateral low back pain without sciatica  Low mean corpuscular volume (MCV)  Pain and tenderness    Assessment and Plan    Chronic widespread pain and low back pain Chronic pain in legs, arms, and torso with intermittent sharp, throbbing characteristics. Low back pain likely postpartum-related. Differential includes fibromyalgia and exertional pain from sickle cell trait. Current pain management inadequate. - Ordered iron panel to assess for iron deficiency. - Discussed potential use of Cymbalta for fibromyalgia management post-breastfeeding. - Advised on back strengthening exercises and weight management. - Previous notes reviewed on 10/01, 8/23/205 and 5/39/2025 - Iron panel pending, previous labs  reviewed show persistently low MCV.   Sickle cell trait with exertional pain symptoms Sickle cell trait may cause exertional pain, possibly related to ECAST. Symptoms worsen with physical activity.   Suspected iron deficiency Borderline MCV suggests possible iron deficiency, contributing to fatigue and pain. - Ordered iron panel to evaluate for iron deficiency.  Hormonal changes postpartum Postpartum hormonal changes causing hot flashes  and irritability, likely due to breastfeeding and delayed menstruation. - Monitor menstrual cycle and milk production for changes.         Dee Paden A. Vita MD [facility]

## 2024-02-08 ENCOUNTER — Ambulatory Visit: Payer: Self-pay | Admitting: Family Medicine

## 2024-02-08 LAB — IRON,TIBC AND FERRITIN PANEL
Ferritin: 35 ng/mL (ref 15–150)
Iron Saturation: 21 % (ref 15–55)
Iron: 67 ug/dL (ref 27–159)
Total Iron Binding Capacity: 325 ug/dL (ref 250–450)
UIBC: 258 ug/dL (ref 131–425)

## 2024-03-26 ENCOUNTER — Ambulatory Visit: Admitting: Family Medicine

## 2024-03-29 ENCOUNTER — Encounter: Payer: Self-pay | Admitting: Family Medicine

## 2024-04-06 ENCOUNTER — Emergency Department (HOSPITAL_COMMUNITY)
Admission: EM | Admit: 2024-04-06 | Discharge: 2024-04-07 | Disposition: A | Discharge status: Home / Self Care | Attending: Emergency Medicine | Admitting: Emergency Medicine

## 2024-04-06 ENCOUNTER — Encounter (HOSPITAL_COMMUNITY): Payer: Self-pay

## 2024-04-06 DIAGNOSIS — R109 Unspecified abdominal pain: Secondary | ICD-10-CM

## 2024-04-06 DIAGNOSIS — R1084 Generalized abdominal pain: Secondary | ICD-10-CM | POA: Diagnosis present

## 2024-04-06 DIAGNOSIS — J45909 Unspecified asthma, uncomplicated: Secondary | ICD-10-CM | POA: Insufficient documentation

## 2024-04-06 DIAGNOSIS — M549 Dorsalgia, unspecified: Secondary | ICD-10-CM | POA: Insufficient documentation

## 2024-04-06 LAB — CBC
HCT: 41.7 % (ref 36.0–46.0)
Hemoglobin: 13.8 g/dL (ref 12.0–15.0)
MCH: 26.3 pg (ref 26.0–34.0)
MCHC: 33.1 g/dL (ref 30.0–36.0)
MCV: 79.6 fL — ABNORMAL LOW (ref 80.0–100.0)
Platelets: 225 K/uL (ref 150–400)
RBC: 5.24 MIL/uL — ABNORMAL HIGH (ref 3.87–5.11)
RDW: 13.7 % (ref 11.5–15.5)
WBC: 7.9 K/uL (ref 4.0–10.5)
nRBC: 0 % (ref 0.0–0.2)

## 2024-04-06 LAB — COMPREHENSIVE METABOLIC PANEL WITH GFR
ALT: 13 U/L (ref 0–44)
AST: 21 U/L (ref 15–41)
Albumin: 4.2 g/dL (ref 3.5–5.0)
Alkaline Phosphatase: 136 U/L — ABNORMAL HIGH (ref 38–126)
Anion gap: 9 (ref 5–15)
BUN: 7 mg/dL (ref 6–20)
CO2: 26 mmol/L (ref 22–32)
Calcium: 9.6 mg/dL (ref 8.9–10.3)
Chloride: 105 mmol/L (ref 98–111)
Creatinine, Ser: 0.82 mg/dL (ref 0.44–1.00)
GFR, Estimated: >60 mL/min
Glucose, Bld: 119 mg/dL — ABNORMAL HIGH (ref 70–99)
Potassium: 4.4 mmol/L (ref 3.5–5.1)
Sodium: 140 mmol/L (ref 135–145)
Total Bilirubin: 0.2 mg/dL (ref 0.0–1.2)
Total Protein: 7 g/dL (ref 6.5–8.1)

## 2024-04-06 LAB — URINALYSIS, ROUTINE W REFLEX MICROSCOPIC
Bilirubin Urine: NEGATIVE
Glucose, UA: NEGATIVE mg/dL
Hgb urine dipstick: NEGATIVE
Ketones, ur: NEGATIVE mg/dL
Leukocytes,Ua: NEGATIVE
Nitrite: NEGATIVE
Protein, ur: NEGATIVE mg/dL
Specific Gravity, Urine: 1.015 (ref 1.005–1.030)
pH: 5 (ref 5.0–8.0)

## 2024-04-06 LAB — LIPASE, BLOOD: Lipase: 30 U/L (ref 11–51)

## 2024-04-06 LAB — HCG, SERUM, QUALITATIVE: Preg, Serum: NEGATIVE

## 2024-04-06 NOTE — ED Triage Notes (Signed)
 Pt states that she has been having back pain and abd pain with some nausea that has been going on all day, pain goes into her groin, denies fevers. Denies discharge, or dysuria but complains of urinary frequency

## 2024-04-07 ENCOUNTER — Emergency Department (HOSPITAL_COMMUNITY)

## 2024-04-07 MED ORDER — KETOROLAC TROMETHAMINE 15 MG/ML IJ SOLN
15.0000 mg | Freq: Once | INTRAMUSCULAR | Status: AC
  Administered 2024-04-07: 15 mg via INTRAVENOUS
  Filled 2024-04-07: qty 1

## 2024-04-07 MED ORDER — OXYCODONE HCL 5 MG PO TABS
5.0000 mg | ORAL_TABLET | Freq: Once | ORAL | Status: AC
  Administered 2024-04-07: 5 mg via ORAL
  Filled 2024-04-07: qty 1

## 2024-04-07 MED ORDER — IOHEXOL 300 MG/ML  SOLN
100.0000 mL | Freq: Once | INTRAMUSCULAR | Status: AC | PRN
  Administered 2024-04-07: 100 mL via INTRAVENOUS

## 2024-04-07 MED ORDER — DICYCLOMINE HCL 20 MG PO TABS: 20.0000 mg | ORAL_TABLET | Freq: Two times a day (BID) | ORAL | 0 refills | Status: AC

## 2024-04-07 NOTE — ED Provider Notes (Signed)
 " [facility] Emergency Department Provider Note MRN:  [id number]  Arrival date & time: 04/07/24     Chief Complaint   Back Pain and Abdominal Pain   History of Present Illness   Erin Mack is a 19 y.o. year-old female with no pertinent past medical history presenting to the ED with chief complaint of back pain.  Bilateral flank pain and diffuse abdominal pain constant for the past 2 months, not going away.  Denies nausea vomiting or diarrhea, no fever, no dysuria or hematuria, no vaginal bleeding or discharge.  Review of Systems  A thorough review of systems was obtained and all systems are negative except as noted in the HPI and PMH.   Patient's Health History    Past Medical History:  Diagnosis Date   Asthma    Chlamydia    Gestational diabetes    Gonorrhea    Pregnancy induced hypertension    Severe preeclampsia, delivered 05/11/2023   Sickle cell trait     Past Surgical History:  Procedure Laterality Date   NO PAST SURGERIES      Family History  Problem Relation Age of Onset   Cancer Paternal Grandfather    Lupus Paternal Grandmother    Cervical cancer Maternal Grandmother    Breast cancer Maternal Grandmother 52   Lupus Father    Sickle cell anemia Mother    Asthma Sister    Autism Sister     Social History   Socioeconomic History   Marital status: Single    Spouse name: Not on file   Number of children: Not on file   Years of education: Not on file   Highest education level: Not on file  Occupational History   Not on file  Tobacco Use   Smoking status: Never    Passive exposure: Never   Smokeless tobacco: Never  Vaping Use   Vaping status: Former  Substance and Sexual Activity   Alcohol use: Not Currently    Comment: occasionally   Drug use: Not Currently    Types: Marijuana    Comment: last used a few months prior to pregnancy   Sexual activity: Yes    Birth control/protection: None  Other Topics Concern    Not on file  Social History Narrative   Not on file   Social Drivers of Health   Tobacco Use: Low Risk (04/06/2024)   Patient History    Smoking Tobacco Use: Never    Smokeless Tobacco Use: Never    Passive Exposure: Never  Financial Resource Strain: Not on file  Food Insecurity: No Food Insecurity (05/10/2023)   Hunger Vital Sign    Worried About Running Out of Food in the Last Year: Never true    Ran Out of Food in the Last Year: Never true  Recent Concern: Food Insecurity - Food Insecurity Present (03/02/2023)   Hunger Vital Sign    Worried About Running Out of Food in the Last Year: Sometimes true    Ran Out of Food in the Last Year: Never true  Transportation Needs: No Transportation Needs (05/10/2023)   PRAPARE - Administrator, Civil Service (Medical): No    Lack of Transportation (Non-Medical): No  Physical Activity: Not on file  Stress: Not on file  Social Connections: Not on file  Intimate Partner Violence: Not At Risk (05/10/2023)   Humiliation, Afraid, Rape, and Kick questionnaire    Fear of Current or Ex-Partner: No    Emotionally Abused:  No    Physically Abused: No    Sexually Abused: No  Depression (PHQ2-9): Low Risk (02/07/2024)   Depression (PHQ2-9)    PHQ-2 Score: 0  Alcohol Screen: Not on file  Housing: Low Risk (05/10/2023)   Housing Stability Vital Sign    Unable to Pay for Housing in the Last Year: No    Number of Times Moved in the Last Year: 0    Homeless in the Last Year: No  Utilities: Not At Risk (05/10/2023)   [employer] Utilities    Threatened with loss of utilities: No  Health Literacy: Not on file     Physical Exam   Vitals:   04/06/24 2126 04/07/24 0033  BP: 118/76 124/76  Pulse: 83 87  Resp: 16 18  Temp: 97.9 F (36.6 C) 99.1 F (37.3 C)  SpO2: 100% 100%    CONSTITUTIONAL: Well-appearing, NAD NEURO/PSYCH:  Alert and oriented x 3, no focal deficits EYES:  eyes equal and reactive ENT/NECK:  no LAD, no JVD CARDIO: Regular  rate, well-perfused, normal S1 and S2 PULM:  CTAB no wheezing or rhonchi GI/GU:  non-distended, non-tender MSK/SPINE:  No gross deformities, no edema SKIN:  no rash, atraumatic   *Additional and/or pertinent findings included in MDM below  Diagnostic and Interventional Summary    EKG Interpretation Date/Time:    Ventricular Rate:    PR Interval:    QRS Duration:    QT Interval:    QTC Calculation:   R Axis:      Text Interpretation:         Labs Reviewed  COMPREHENSIVE METABOLIC PANEL WITH GFR - Abnormal; Notable for the following components:      Result Value   Glucose, Bld 119 (*)    Alkaline Phosphatase 136 (*)    All other components within normal limits  CBC - Abnormal; Notable for the following components:   RBC 5.24 (*)    MCV 79.6 (*)    All other components within normal limits  LIPASE, BLOOD  URINALYSIS, ROUTINE W REFLEX MICROSCOPIC  HCG, SERUM, QUALITATIVE    CT ABDOMEN PELVIS W CONTRAST  Final Result      Medications  oxyCODONE  (Oxy IR/ROXICODONE ) immediate release tablet 5 mg (has no administration in time range)  ketorolac  (TORADOL ) 15 MG/ML injection 15 mg (15 mg Intravenous Given 04/07/24 0231)  iohexol  (OMNIPAQUE ) 300 MG/ML solution 100 mL (100 mLs Intravenous Contrast Given 04/07/24 0236)     Procedures  /  Critical Care Procedures  ED Course and Medical Decision Making  Initial Impression and Ddx Differential diagnosis includes diverticulitis, appendicitis, perforated viscus, kidney stone, pyelonephritis.  Past medical/surgical history that increases complexity of ED encounter: None  Interpretation of Diagnostics I personally reviewed the Laboratory Testing and my interpretation is as follows: No significant blood count or electrolyte disturbance.  CT is unremarkable.  Patient Reassessment and Ultimate Disposition/Management     Patient with some continued pain on reassessment but is nontoxic-appearing with normal vital signs.   Abdomen is soft and nontender.  Per chart review, she has had a few negative abdominal pain workups over the past 8 months.  Unclear etiology, but no signs that this is an emergent process.  I think she would be best served with discharge and follow-up with gastroenterology.  Patient management required discussion with the following services or consulting groups:  None  Complexity of Problems Addressed Acute illness or injury that poses threat of life of bodily function  Additional Data Reviewed and  Analyzed Further history obtained from: Further history from spouse/family member  Additional Factors Impacting ED Encounter Risk Consideration of hospitalization  Ozell HERO. Theadore, MD [facility] Emergency Medicine [facility] [email or web address]  Final Clinical Impressions(s) / ED Diagnoses     ICD-10-CM   1. Abdominal pain, unspecified abdominal location  R10.9       ED Discharge Orders          Ordered    dicyclomine  (BENTYL ) 20 MG tablet  2 times daily        04/07/24 9667             Discharge Instructions Discussed with and Provided to Patient:     Discharge Instructions      You were evaluated in the Emergency Department and after careful evaluation, we did not find any emergent condition requiring admission or further testing in the hospital.  Your exam/testing today is overall reassuring.  You have been having unexplained abdominal pain for several months now, we recommend follow-up with the gastroenterologists for further evaluation.  Until then can use the Bentyl  medication as needed for pain.  Please return to the Emergency Department if you experience any worsening of your condition.   Thank you for allowing us  to be a part of your care.       Theadore Ozell HERO, MD 04/07/24 [phone]  "

## 2024-04-07 NOTE — Discharge Instructions (Signed)
 You were evaluated in the Emergency Department and after careful evaluation, we did not find any emergent condition requiring admission or further testing in the hospital.  Your exam/testing today is overall reassuring.  You have been having unexplained abdominal pain for several months now, we recommend follow-up with the gastroenterologists for further evaluation.  Until then can use the Bentyl  medication as needed for pain.  Please return to the Emergency Department if you experience any worsening of your condition.   Thank you for allowing us  to be a part of your care.

## 2024-04-11 ENCOUNTER — Encounter: Payer: Self-pay | Admitting: Family Medicine

## 2024-04-11 ENCOUNTER — Ambulatory Visit: Admitting: Family Medicine

## 2024-04-11 VITALS — BP 122/70 | HR 111 | Ht 65.0 in | Wt 209.0 lb

## 2024-04-11 DIAGNOSIS — D573 Sickle-cell trait: Secondary | ICD-10-CM

## 2024-04-11 DIAGNOSIS — E559 Vitamin D deficiency, unspecified: Secondary | ICD-10-CM

## 2024-04-11 DIAGNOSIS — Z840 Family history of diseases of the skin and subcutaneous tissue: Secondary | ICD-10-CM

## 2024-04-11 DIAGNOSIS — R109 Unspecified abdominal pain: Secondary | ICD-10-CM

## 2024-04-11 DIAGNOSIS — G8929 Other chronic pain: Secondary | ICD-10-CM

## 2024-04-11 DIAGNOSIS — Z7689 Persons encountering health services in other specified circumstances: Secondary | ICD-10-CM

## 2024-04-11 DIAGNOSIS — M545 Low back pain, unspecified: Secondary | ICD-10-CM

## 2024-04-11 DIAGNOSIS — R635 Abnormal weight gain: Secondary | ICD-10-CM

## 2024-04-11 DIAGNOSIS — Z8632 Personal history of gestational diabetes: Secondary | ICD-10-CM

## 2024-04-11 MED ORDER — METHOCARBAMOL 500 MG PO TABS: 500.0000 mg | ORAL_TABLET | Freq: Three times a day (TID) | ORAL | 1 refills | Status: AC | PRN

## 2024-04-11 NOTE — Progress Notes (Signed)
 "  Patient Office Visit  Assessment & Plan:  Encounter to establish care -     CBC with Differential/Platelet -     Comprehensive metabolic panel with GFR -     Lipid panel -     TSH  History of gestational diabetes -     Hemoglobin A1c  Family history of lupus erythematosus  Vitamin D  deficiency -     VITAMIN D  25 Hydroxy (Vit-D Deficiency, Fractures)  Sickle cell trait -     CBC with Differential/Platelet  Abdominal pain, unspecified abdominal location  Weight gain -     TSH  Chronic midline low back pain without sciatica -     Methocarbamol ; Take 1 tablet (500 mg total) by mouth every 8 (eight) hours as needed.  Dispense: 30 tablet; Refill: 1   Assessment and Plan    Chronic low back pain and abdominal pain Chronic low back and abdominal pain with minimal relief from current analgesics. Normal CT scan. Differential includes musculoskeletal pain and possible irritable bowel syndrome. - Prescribed muscle relaxant post-breastfeeding. - Continue ibuprofen  and Tylenol  as needed. - Encouraged stretching exercises. - Consider physical therapy if feasible.  Vitamin D  deficiency Ongoing muscle pains possibly related to vitamin D  deficiency despite supplementation. - Ordered blood work to check vitamin D  levels.  History of gestational diabetes Gestational diabetes managed with diet. Increased risk for type 2 diabetes. - Ordered blood work to check A1c levels.  Sickle cell trait Sickle cell trait with no symptoms. Advised on activity precautions. - Advised on precautions during travel and strenuous activities.  Weight gain Weight gain post-pregnancy. Discussed diet and physical activity. Motivated to improve lifestyle. - Encouraged increased physical activity and healthy diet. - Provided dietary guidance.      Return if symptoms worsen or fail to improve, for physical.   Subjective:    Patient ID: Erin Mack, female    DOB: [birth year]  Age: 19 y.o. MRN:  [id number]  Chief Complaint  Patient presents with   Medical Management of Chronic Issues    Strong family hx of lupus. Pt has went to rheumatology due to pain all over.   Establish Care    HPI Discussed the use of AI scribe software for clinical note transcription with the patient, who gave verbal consent to proceed.  History of Present Illness        History of Present Illness Erin Mack is a 19 year old female who presents with chronic back and abdominal pain. Patient is here to establish primary care in our office  She has been experiencing chronic back pain for approximately two months, which has progressively worsened. The pain is primarily located in the lower back but occasionally affects the upper back. It is described as sharp and interferes with her sleep, making it difficult to find a comfortable position at night. She has been taking ibuprofen  and Tylenol , which provide minimal relief. No problems with urination, burning, or blood in the urine.  She also reports abdominal pain that began about a month ago. The pain is described as a rolling sensation occurring every ten seconds, three to four times a day. She associates the worsening of her back pain with the onset of abdominal pain. A recent CT scan was performed, which showed no abnormalities.  Her past medical history includes gestational diabetes managed with dietary changes and preeclampsia diagnosed at the time of delivery. She also has a history of low vitamin D  levels, for which she  takes vitamin D  supplements. During her pregnancy, she experienced thyroid irregularities, but no medication was prescribed.  Family history is significant for lupus and sickle cell disease. Her mother has sickle cell disease, and she carries the sickle cell trait. There is a strong family history of autoimmune conditions, including lupus, though the specific type is unclear.  Socially, she is a mother to an almost [age]-year-old  daughter and is currently breastfeeding. She lives with her mother and her daughter's father is involved in their lives. She has a history of being a horticulturist, commercial and is trying to incorporate more physical activity into her routine.  Physical Exam MUSCULOSKELETAL: Limited range of motion in lumbar spine with pain on flexion.  Results Labs Urine pregnancy (03/2024): Negative  Assessment and Plan Chronic low back pain and abdominal pain Chronic low back and abdominal pain with minimal relief from current analgesics. Normal CT scan. Differential includes musculoskeletal pain and possible irritable bowel syndrome. - Prescribed muscle relaxant post-breastfeeding. - Continue ibuprofen  and Tylenol  as needed. - Encouraged stretching exercises. - Consider physical therapy if feasible.  Vitamin D  deficiency Ongoing muscle pains possibly related to vitamin D  deficiency despite supplementation. - Ordered blood work to check vitamin D  levels.  History of gestational diabetes Gestational diabetes managed with diet. Increased risk for type 2 diabetes. - Ordered blood work to check A1c levels.  Sickle cell trait Sickle cell trait with no symptoms. Advised on activity precautions. - Advised on precautions during travel and strenuous activities.  Weight gain Weight gain post-pregnancy. Discussed diet and physical activity. Motivated to improve lifestyle. - Encouraged increased physical activity and healthy diet. - Provided dietary guidance.   The ASCVD Risk score (Arnett DK, et al., 2019) failed to calculate for the following reasons:   The 2019 ASCVD risk score is only valid for ages 82 to 79   * - Cholesterol units were assumed  Past Medical History:  Diagnosis Date   Asthma    Chlamydia    Gestational diabetes    Gonorrhea    Pregnancy induced hypertension    Severe preeclampsia, delivered 05/11/2023   Sickle cell trait    Past Surgical History:  Procedure Laterality Date   NO PAST  SURGERIES     Social History[1] Family History  Problem Relation Age of Onset   Sickle cell anemia Mother    Lupus Father    Asthma Sister    Autism Sister    Cervical cancer Maternal Grandmother    Breast cancer Maternal Grandmother 46   Lupus Paternal Grandmother    Dementia Paternal Grandmother    Cancer Paternal Grandfather        leukemia   Allergies[2]  ROS    Objective:    BP 122/70   Pulse (!) 111   Ht 5' 5 (1.651 m)   Wt 209 lb (94.8 kg)   LMP 03/26/2024 (Approximate)   SpO2 97%   Breastfeeding Yes Comment: currently trying to wean off of breast feeding.  BMI 34.78 kg/m  BP Readings from Last 3 Encounters:  04/11/24 122/70  04/07/24 126/80  02/07/24 124/80   Wt Readings from Last 3 Encounters:  04/11/24 209 lb (94.8 kg)  02/07/24 208 lb 6.4 oz (94.5 kg)  11/12/23 190 lb (86.2 kg)    Physical Exam Vitals and nursing note reviewed.  Constitutional:      General: She is not in acute distress.    Appearance: Normal appearance.  HENT:     Head: Normocephalic.  Right Ear: Tympanic membrane, ear canal and external ear normal.     Left Ear: Tympanic membrane, ear canal and external ear normal.  Eyes:     Extraocular Movements: Extraocular movements intact.     Conjunctiva/sclera: Conjunctivae normal.     Pupils: Pupils are equal, round, and reactive to light.  Cardiovascular:     Rate and Rhythm: Normal rate and regular rhythm.     Heart sounds: Normal heart sounds.  Pulmonary:     Effort: Pulmonary effort is normal.     Breath sounds: Normal breath sounds.  Abdominal:     General: Bowel sounds are normal.     Tenderness: There is no abdominal tenderness. There is no guarding.  Musculoskeletal:     Lumbar back: Tenderness and bony tenderness present. Negative right straight leg raise test and negative left straight leg raise test.     Right lower leg: No edema.     Left lower leg: No edema.     Comments: Patient has tenderness middle of her  back to palpation.   Neurological:     General: No focal deficit present.     Mental Status: She is alert and oriented to person, place, and time.     Motor: No weakness.     Gait: Gait normal.  Psychiatric:        Mood and Affect: Mood normal.        Behavior: Behavior normal.      No results found for any visits on 04/11/24.          [1]  Social History Tobacco Use   Smoking status: Never    Passive exposure: Never   Smokeless tobacco: Never  Vaping Use   Vaping status: Former  Substance Use Topics   Alcohol use: Not Currently    Comment: occasionally   Drug use: Not Currently    Comment: last used a few months prior to pregnancy  [2]  Allergies Allergen Reactions   Black Walnut Pollen Allergy Skin Test Shortness Of Breath, Itching and Other (See Comments)    Mouth itches   Charentais Melon (French Melon) Shortness Of Breath, Itching, Nausea Only and Other (See Comments)    Mouth itches   Justicia Adhatoda (Malabar Nut Tree) [Justicia Adhatoda] Shortness Of Breath, Itching and Other (See Comments)    Mouth itches   Sesame Oil Shortness Of Breath, Itching and Other (See Comments)    Mouth itches   Watermelon [Citrullus Vulgaris] Shortness Of Breath, Itching and Other (See Comments)    Mouth itches   Aloe Rash and Other (See Comments)    Made the skin burn   "

## 2024-04-12 LAB — COMPREHENSIVE METABOLIC PANEL WITH GFR
AG Ratio: 1.7 (calc) (ref 1.0–2.5)
ALT: 14 U/L (ref 6–29)
AST: 13 U/L (ref 10–30)
Albumin: 4.5 g/dL (ref 3.6–5.1)
Alkaline phosphatase (APISO): 98 U/L (ref 31–125)
BUN: 9 mg/dL (ref 7–25)
CO2: 28 mmol/L (ref 20–32)
Calcium: 9.6 mg/dL (ref 8.6–10.2)
Chloride: 104 mmol/L (ref 98–110)
Creat: 0.77 mg/dL (ref 0.50–0.96)
Globulin: 2.7 g/dL (ref 1.9–3.7)
Glucose, Bld: 85 mg/dL (ref 65–99)
Potassium: 4.8 mmol/L (ref 3.5–5.3)
Sodium: 140 mmol/L (ref 135–146)
Total Bilirubin: 0.4 mg/dL (ref 0.2–1.2)
Total Protein: 7.2 g/dL (ref 6.1–8.1)
eGFR: 113 mL/min/1.73m2

## 2024-04-12 LAB — CBC WITH DIFFERENTIAL/PLATELET
Absolute Lymphocytes: 2424 {cells}/uL (ref 850–3900)
Absolute Monocytes: 511 {cells}/uL (ref 200–950)
Basophils Absolute: 37 {cells}/uL (ref 0–200)
Basophils Relative: 0.5 %
Eosinophils Absolute: 701 {cells}/uL — ABNORMAL HIGH (ref 15–500)
Eosinophils Relative: 9.6 %
HCT: 40.3 % (ref 35.9–46.0)
Hemoglobin: 13.5 g/dL (ref 11.7–15.5)
MCH: 26.3 pg — ABNORMAL LOW (ref 27.0–33.0)
MCHC: 33.5 g/dL (ref 31.6–35.4)
MCV: 78.4 fL — ABNORMAL LOW (ref 81.4–101.7)
MPV: 12.1 fL (ref 7.5–12.5)
Monocytes Relative: 7 %
Neutro Abs: 3628 {cells}/uL (ref 1500–7800)
Neutrophils Relative %: 49.7 %
Platelets: 211 Thousand/uL (ref 140–400)
RBC: 5.14 Million/uL — ABNORMAL HIGH (ref 3.80–5.10)
RDW: 13.4 % (ref 11.0–15.0)
Total Lymphocyte: 33.2 %
WBC: 7.3 Thousand/uL (ref 3.8–10.8)

## 2024-04-12 LAB — LIPID PANEL
Cholesterol: 134 mg/dL
HDL: 53 mg/dL
LDL Cholesterol (Calc): 68 mg/dL
Non-HDL Cholesterol (Calc): 81 mg/dL
Total CHOL/HDL Ratio: 2.5 (calc)
Triglycerides: 58 mg/dL

## 2024-04-12 LAB — HEMOGLOBIN A1C
Hgb A1c MFr Bld: 5.5 %
Mean Plasma Glucose: 111 mg/dL
eAG (mmol/L): 6.2 mmol/L

## 2024-04-12 LAB — VITAMIN D 25 HYDROXY (VIT D DEFICIENCY, FRACTURES): Vit D, 25-Hydroxy: 22 ng/mL — ABNORMAL LOW (ref 30–100)

## 2024-04-12 LAB — TSH: TSH: 0.94 m[IU]/L

## 2024-04-17 ENCOUNTER — Ambulatory Visit: Payer: Self-pay | Admitting: Family Medicine

## 2024-07-30 ENCOUNTER — Encounter: Admitting: Family Medicine
